# Patient Record
Sex: Male | Born: 1975 | Race: White | Hispanic: No | Marital: Married | Smoking: Never smoker
Health system: Southern US, Community
[De-identification: ages and names within clinical notes are randomized; demographics above are authoritative.]

## PROBLEM LIST (undated history)

## (undated) DIAGNOSIS — J45909 Unspecified asthma, uncomplicated: Secondary | ICD-10-CM

## (undated) DIAGNOSIS — D869 Sarcoidosis, unspecified: Secondary | ICD-10-CM

## (undated) DIAGNOSIS — E669 Obesity, unspecified: Secondary | ICD-10-CM

## (undated) DIAGNOSIS — M1990 Unspecified osteoarthritis, unspecified site: Secondary | ICD-10-CM

## (undated) DIAGNOSIS — T7840XA Allergy, unspecified, initial encounter: Secondary | ICD-10-CM

## (undated) DIAGNOSIS — A6000 Herpesviral infection of urogenital system, unspecified: Secondary | ICD-10-CM

## (undated) DIAGNOSIS — IMO0002 Reserved for concepts with insufficient information to code with codable children: Secondary | ICD-10-CM

## (undated) HISTORY — DX: Allergy, unspecified, initial encounter: T78.40XA

## (undated) HISTORY — DX: Herpesviral infection of urogenital system, unspecified: A60.00

## (undated) HISTORY — DX: Reserved for concepts with insufficient information to code with codable children: IMO0002

## (undated) HISTORY — DX: Unspecified osteoarthritis, unspecified site: M19.90

## (undated) HISTORY — DX: Obesity, unspecified: E66.9

## (undated) HISTORY — DX: Sarcoidosis, unspecified: D86.9

---

## 2005-04-09 ENCOUNTER — Encounter: Admission: RE | Admit: 2005-04-09 | Discharge: 2005-04-09 | Payer: Self-pay | Admitting: Family Medicine

## 2005-05-23 ENCOUNTER — Encounter: Admission: RE | Admit: 2005-05-23 | Discharge: 2005-05-23 | Payer: Self-pay | Admitting: Family Medicine

## 2005-10-15 HISTORY — PX: LUMBAR DISC SURGERY: SHX700

## 2005-11-14 ENCOUNTER — Ambulatory Visit (HOSPITAL_COMMUNITY): Admission: RE | Admit: 2005-11-14 | Discharge: 2005-11-15 | Payer: Self-pay | Admitting: Orthopedic Surgery

## 2006-06-26 ENCOUNTER — Ambulatory Visit: Payer: Self-pay | Admitting: Family Medicine

## 2008-09-04 ENCOUNTER — Emergency Department (HOSPITAL_COMMUNITY): Admission: EM | Admit: 2008-09-04 | Discharge: 2008-09-04 | Payer: Self-pay | Admitting: Emergency Medicine

## 2011-03-02 NOTE — Op Note (Signed)
NAME:  DUFF, POZZI           ACCOUNT NO.:  [id number]   MEDICAL RECORD NO.:  [id number]          PATIENT TYPE:  OIB   LOCATION:  1008                         FACILITY:  [facility]   PHYSICIAN:  Georges Lynch. Gioffre, M.D.DATE OF BIRTH:  08-07-1976   DATE OF PROCEDURE:  11/14/2005  DATE OF DISCHARGE:                                 OPERATIVE REPORT   SURGEON:  Dr. Darrelyn Hillock   ASSISTANT:  Dr. Jene Every.   PREOPERATIVE DIAGNOSIS:  Large herniated lumbar disk L5-S1 on the right.   POSTOPERATIVE DIAGNOSIS:  Large herniated lumbar disk L5-S1 on the right.   OPERATION:  Hemilaminectomy and microdiskectomy at L5-S1 on the right.   PROCEDURE:  Under general anesthesia, the patient first had 1 gram of IV  Ancef preop.  Sterile prep and draping of the back was carried out.  Two  needles were placed in the back for localization purposes.  X-ray was taken.  We identified the L5-S1 interspace.  An incision was made over the L5-S1  interspace, bleeders identified and cauterized.  Following that, self-  retaining retractors were inserted.  The incision was carried down through  the lumbar fascia, and muscles were separated then from the lamina and  spinous processes by sharp dissection.  The self-retaining retractors were  advanced.  At this time, we identified the L5-S1 interspace; an x-ray was  taken again.  Once we identified the appropriate space, we then carried out  a hemilaminectomy in the usual fashion.  Great care was taken to protect the  dura at all times.  We then removed the ligamentum flavum.  We did utilize  cottonoids up under the ligamentum flavum to protect the dura.  We then went  down and gently retracted the dura and the S1 root with the D'Errico  retractor.  We cauterized lateral recess veins, identified the disk, and he  had an extremely large herniated disk.  A cruciate incision was made in the  posterior longitudinal ligament and, literally, the disk and the fragments  extruded out under pressure.  We then completed the diskectomy.  Multiple  attempts were made into the disk space make sure we had all the disk out.  We then were able to utilize a hockey-stick to run down the S1 root to make  sure it was free and, also, we used the hockey-stick up proximally and  medially, and there were no other disk fragments noted.  The dura and nerve  root was nicely decompressed.  We thoroughly irrigated out the area.  I  loosely applied some thrombin-soaked Gelfoam.  The deep part of the wound  was closed except for a very small portion proximally for drainage purposes.  The remaining part of the wound was closed in the usual fashion.  I injected  30 mL of 0.5% Marcaine with epinephrine into the muscle.  The man had a  large tattoo on his back that we made a careful attempt to bring the tattoo  back to its original position.  We utilized metal staples and closed the  skin.  Sterile Neosporin dressing was applied.  ______________________________  Georges Lynch Darrelyn Hillock, M.D.     RAG/MEDQ  D:  11/14/2005  T:  11/14/2005  Job:  [id number]

## 2011-07-17 LAB — DIFFERENTIAL
Basophils Absolute: 0
Basophils Relative: 0
Eosinophils Absolute: 0.2
Monocytes Relative: 9
Neutrophils Relative %: 70

## 2011-07-17 LAB — CBC
HCT: 41.1
MCHC: 34.1
MCV: 88
Platelets: 333

## 2011-07-17 LAB — URINALYSIS, ROUTINE W REFLEX MICROSCOPIC
Bilirubin Urine: NEGATIVE
Glucose, UA: NEGATIVE
Hgb urine dipstick: NEGATIVE
Protein, ur: NEGATIVE
Urobilinogen, UA: 0.2

## 2011-07-17 LAB — BASIC METABOLIC PANEL
BUN: 13
Calcium: 9.5
Chloride: 106
GFR calc Af Amer: >60
Glucose, Bld: 96

## 2011-10-16 HISTORY — PX: LYMPH NODE BIOPSY: SHX201

## 2012-03-14 ENCOUNTER — Telehealth (INDEPENDENT_AMBULATORY_CARE_PROVIDER_SITE_OTHER): Payer: Self-pay

## 2012-03-14 NOTE — Telephone Encounter (Signed)
Dr Lanier Ensign referred the pt for an enlarged lymph node.  He is not able to get in before 6/25 and they want to know if you recommend he order any tests prior?  Please call Carollee Herter and let them know.

## 2012-03-25 ENCOUNTER — Encounter (INDEPENDENT_AMBULATORY_CARE_PROVIDER_SITE_OTHER): Payer: Self-pay | Admitting: Surgery

## 2012-03-28 ENCOUNTER — Ambulatory Visit (INDEPENDENT_AMBULATORY_CARE_PROVIDER_SITE_OTHER): Payer: BC Managed Care – PPO | Admitting: Surgery

## 2012-03-31 ENCOUNTER — Encounter (INDEPENDENT_AMBULATORY_CARE_PROVIDER_SITE_OTHER): Payer: Self-pay | Admitting: Surgery

## 2012-03-31 ENCOUNTER — Ambulatory Visit (INDEPENDENT_AMBULATORY_CARE_PROVIDER_SITE_OTHER): Payer: BC Managed Care – PPO | Admitting: Surgery

## 2012-03-31 VITALS — BP 120/82 | HR 60 | Temp 97.6°F | Resp 18 | Ht 68.5 in | Wt 213.0 lb

## 2012-03-31 DIAGNOSIS — R590 Localized enlarged lymph nodes: Secondary | ICD-10-CM | POA: Insufficient documentation

## 2012-03-31 DIAGNOSIS — R599 Enlarged lymph nodes, unspecified: Secondary | ICD-10-CM

## 2012-03-31 NOTE — Progress Notes (Signed)
Patient ID: Frederick Macias, male   DOB: [birth year], 36 y.o.   MRN: [id number]  Chief Complaint  Patient presents with  . Other    lymphadenopathy    HPI Frederick Macias is a 36 y.o. male.   this is a pleasant gentleman referred by Dr. Lenise Arena at [place] at [facility] for evaluation of inguinal lymphadenopathy. The patient first noticed large lymph nodes in his groins several months ago when showering. These are causing slight discomfort and have gotten larger. He has been on multiple courses of antibiotics and has not improved. He has increased his medications for his herpes but has not had an outbreak in 8 years. He has no constitutional symptoms. He denies fevers or chills. He did have some right axillary discomfort and some low back pain which is now resolved. He is otherwise without complaints. The discomfort is only mild HPI  Past Medical History  Diagnosis Date  . Genital herpes   . Allergy   . Arthritis   . Obesity     History reviewed. No pertinent past surgical history.  History reviewed. No pertinent family history.  Social History History  Substance Use Topics  . Smoking status: Never Smoker   . Smokeless tobacco: Not on file  . Alcohol Use: Yes    No Known Allergies  Current Outpatient Prescriptions  Medication Sig Dispense Refill  . cetirizine (ZYRTEC) 10 MG tablet Take 10 mg by mouth daily.      . ciprofloxacin (CIPRO) 500 MG/5ML (10%) suspension Take by mouth 2 (two) times daily.      . Multiple Vitamins-Minerals (MULTIVITAMIN WITH MINERALS) tablet Take 1 tablet by mouth daily.      . Naproxen Sodium (NAPRELAN) 375 MG TB24 Take by mouth.      . valACYclovir (VALTREX) 500 MG tablet Take 500 mg by mouth 2 (two) times daily.        Review of Systems Review of Systems  Constitutional: Negative for fever, chills, fatigue and unexpected weight change.  HENT: Negative for hearing loss, congestion, sore throat, trouble swallowing and voice change.   Eyes:  Negative for visual disturbance.  Respiratory: Negative for cough and wheezing.   Cardiovascular: Negative for chest pain, palpitations and leg swelling.  Gastrointestinal: Negative for nausea, vomiting, abdominal pain, diarrhea, constipation, blood in stool, abdominal distention, anal bleeding and rectal pain.  Genitourinary: Negative for hematuria and difficulty urinating.  Musculoskeletal: Positive for back pain. Negative for arthralgias.  Skin: Negative for rash and wound.  Neurological: Negative for seizures, syncope, weakness and headaches.  Hematological: Negative for adenopathy. Does not bruise/bleed easily.  Psychiatric/Behavioral: Negative for confusion.    Blood pressure 120/82, pulse 60, temperature 97.6 F (36.4 C), temperature source Temporal, resp. rate 18, height 5' 8.5" (1.74 m), weight 213 lb (96.616 kg).  Physical Exam Physical Exam  Constitutional: He is oriented to person, place, and time. He appears well-developed and well-nourished. No distress.  HENT:  Head: Normocephalic and atraumatic.  Right Ear: External ear normal.  Left Ear: External ear normal.  Nose: Nose normal.  Mouth/Throat: Oropharynx is clear and moist. No oropharyngeal exudate.  Eyes: Conjunctivae and EOM are normal. Pupils are equal, round, and reactive to light. No scleral icterus.  Neck: Normal range of motion. Neck supple. No tracheal deviation present. No thyromegaly present.  Cardiovascular: Normal rate, regular rhythm, normal heart sounds and intact distal pulses.   No murmur heard. Pulmonary/Chest: Effort normal and breath sounds normal. No respiratory distress. He has no  wheezes. He has no rales.  Abdominal: Soft. Bowel sounds are normal. He exhibits no distension. There is no tenderness. There is no rebound.  Musculoskeletal: Normal range of motion. He exhibits no edema and no tenderness.  Lymphadenopathy:    He has no cervical adenopathy.    He has no axillary adenopathy.       Right:  Inguinal adenopathy present.       Left: Inguinal adenopathy present.  Neurological: He is alert and oriented to person, place, and time.  Skin: Skin is warm and dry. No rash noted. He is not diaphoretic. No erythema.  Psychiatric: His behavior is normal. Judgment normal.    Data Reviewed I have the notes from Dr. Lenise Arena which I have reviewed  Assessment    Bilateral inguinal lymphadenopathy    Plan    We will proceed with excisional biopsy of lymph node in the operating room for histologic evaluation to rule out malignancy. I discussed this with him in detail. Her only other option would be to proceed with x-rays and CAT scans which even if positive, would still necessitate a biopsy for histologic evaluation. I discussed the risks of surgery which includes but is not limited to bleeding, infection, chronic swelling, injury to surrounding structures, and it has been noted nondiagnostic and further surgery is needed. He understands and wishes to proceed.       Kylan Veach A 03/31/2012, 9:15 AM

## 2012-04-07 ENCOUNTER — Other Ambulatory Visit (INDEPENDENT_AMBULATORY_CARE_PROVIDER_SITE_OTHER): Payer: Self-pay | Admitting: Surgery

## 2012-04-07 DIAGNOSIS — R599 Enlarged lymph nodes, unspecified: Secondary | ICD-10-CM

## 2012-04-08 ENCOUNTER — Ambulatory Visit (INDEPENDENT_AMBULATORY_CARE_PROVIDER_SITE_OTHER): Payer: BC Managed Care – PPO | Admitting: Surgery

## 2012-04-08 ENCOUNTER — Telehealth (INDEPENDENT_AMBULATORY_CARE_PROVIDER_SITE_OTHER): Payer: Self-pay | Admitting: General Surgery

## 2012-04-08 NOTE — Telephone Encounter (Signed)
DR. Luisa Hart CALLED RE PATHOLOGY REPORT FROM LYMPH NODE SAMPLING. HE STATED" IT LOOKED LIKE A NON-NECROTIZING GRANULOMA BUT NEED MORE CLINICAL/ WOULD LIKE D. BLACKMAN TO CALL HIM IN AM PLEASE. [phone]/ THANKS/GY/ MESSAGE ROUTED TO DR. BLACKMAN.

## 2012-04-09 ENCOUNTER — Encounter (INDEPENDENT_AMBULATORY_CARE_PROVIDER_SITE_OTHER): Payer: Self-pay

## 2012-04-16 ENCOUNTER — Ambulatory Visit
Admission: RE | Admit: 2012-04-16 | Discharge: 2012-04-16 | Disposition: A | Discharge status: Home / Self Care | Payer: BC Managed Care – PPO | Source: Ambulatory Visit | Attending: Surgery | Admitting: Surgery

## 2012-04-16 ENCOUNTER — Encounter (INDEPENDENT_AMBULATORY_CARE_PROVIDER_SITE_OTHER): Payer: Self-pay | Admitting: Surgery

## 2012-04-16 ENCOUNTER — Ambulatory Visit (INDEPENDENT_AMBULATORY_CARE_PROVIDER_SITE_OTHER): Payer: BC Managed Care – PPO | Admitting: Surgery

## 2012-04-16 VITALS — BP 118/72 | HR 80 | Temp 97.4°F | Resp 18 | Ht 68.0 in | Wt 218.0 lb

## 2012-04-16 DIAGNOSIS — D869 Sarcoidosis, unspecified: Secondary | ICD-10-CM

## 2012-04-16 DIAGNOSIS — Z09 Encounter for follow-up examination after completed treatment for conditions other than malignant neoplasm: Secondary | ICD-10-CM

## 2012-04-16 NOTE — Progress Notes (Signed)
Subjective:     Patient ID: Frederick Macias, male   DOB: [birth year], 36 y.o.   MRN: [id number]  HPI He is here for his first postop visit status post excisional biopsy of large right inguinal lymph nodes. He is doing well and has only mild discomfort  Review of Systems     Objective:   Physical Exam On exam, his incision does have a seroma underneath. It is otherwise feeling well. I drained 10 cc of seroma fluid with a needle.  The pathology showed findings consistent with sarcoidosis. There was no evidence of malignancy    Assessment:     Patient status post excisional biopsy of right inguinal lymph nodes with a probable diagnosis of sarcoid    Plan:     I am going to go ahead and check a chest x-ray and refer him back to his primary care physician. He is otherwise asymptomatic. We had a discussion about sarcoid in the office as well as on the phone when I first learned of his diagnosis

## 2012-08-26 ENCOUNTER — Encounter: Payer: Self-pay | Admitting: *Deleted

## 2012-08-27 ENCOUNTER — Encounter: Payer: Self-pay | Admitting: Internal Medicine

## 2012-08-27 ENCOUNTER — Ambulatory Visit (INDEPENDENT_AMBULATORY_CARE_PROVIDER_SITE_OTHER): Payer: BC Managed Care – PPO | Admitting: Internal Medicine

## 2012-08-27 VITALS — BP 120/78 | HR 91 | Ht 68.5 in | Wt 234.0 lb

## 2012-08-27 DIAGNOSIS — D869 Sarcoidosis, unspecified: Secondary | ICD-10-CM

## 2012-08-27 DIAGNOSIS — R05 Cough: Secondary | ICD-10-CM

## 2012-08-27 NOTE — Progress Notes (Signed)
  Subjective:    Patient ID: Frederick Macias, male    DOB: 22-Dec-1975  MRN: [id number]  HPI  71 yowm never smoker with swelling R > L inguinal spring 2013 > bx c/w sarcoid never rx steroids,  referred 08/27/2012  By Rosana Berger to pulmonary clinic with abn cxr.   08/27/2012 1st pulmonary eval cc mild cough onset insidious x one year min productive daily more each am but not typically waking at night and chest discomfort with heavy exertion and less ex tolerance assoc with less activity and wt gain but no limiting sob - takes naprosyn for back pain since 2007 and cough better with zyrtec/ robitussin.  No peripheral arthralgias, rash, viz problems, ns, wt loss.  Sleeping ok without nocturnal  or early am exacerbation  of respiratory  c/o's or need for noct saba. Also denies any obvious fluctuation of symptoms with weather or environmental changes or other aggravating or alleviating factors except as outlined above   Review of Systems  Constitutional: Negative for fever and unexpected weight change.  HENT: Negative for ear pain, nosebleeds, congestion, sore throat, rhinorrhea, sneezing, trouble swallowing, dental problem, postnasal drip and sinus pressure.   Eyes: Negative for redness and itching.  Respiratory: Positive for cough. Negative for chest tightness, shortness of breath and wheezing.   Cardiovascular: Positive for chest pain. Negative for palpitations and leg swelling.  Gastrointestinal: Negative for nausea and vomiting.  Genitourinary: Negative for dysuria.  Musculoskeletal: Negative for joint swelling.  Skin: Negative for rash.  Neurological: Positive for headaches.  Hematological: Does not bruise/bleed easily.  Psychiatric/Behavioral: Negative for dysphoric mood. The patient is not nervous/anxious.        Objective:   Physical Exam  Wt Readings from Last 3 Encounters:  08/27/12 234 lb (106.142 kg)  04/16/12 218 lb (98.884 kg)  03/31/12 213 lb (96.616 kg)   amb  pleasant wm nad HEENT: nl dentition, turbinates, and orophanx. Nl external ear canals without cough reflex   NECK :  without JVD/Nodes/TM/ nl carotid upstrokes bilaterally   LUNGS: no acc muscle use, clear to A and P bilaterally without cough on insp or exp maneuvers   CV:  RRR  no s3 or murmur or increase in P2, no edema   ABD:  soft and nontender with nl excursion in the supine position. No bruits or organomegaly, bowel sounds nl - Large marble sized R Inguinal Node  MS:  warm without deformities, calf tenderness, cyanosis or clubbing  SKIN: warm and dry without lesions    NEURO:  alert, approp, no deficits   cxr 04/16/12 1. Mild prominence of the right paratracheal stripe than hilar  areas bilaterally is compatible with sarcoid.  2. No acute abnormality otherwise     Assessment & Plan:

## 2012-08-27 NOTE — Patient Instructions (Addendum)
Sarcoidosis is a benign inflammatory condition caused by  The  immune system being too revved up like a thermostat on your furnace that's partially  stuck causing arthitis, rash, short of breath and cough and vision issues.  It typically burns itself out in 75% of patients by the end of 3 years with little to indicate that we really change the natural course of the disease by aggressive treatments intended to alter it.  Treatment is generally reserved for patients with major symptoms (worse cough or sob with activity)  we can attribute to sarcoid or if a vital organ (like the eye or kidney or nervous system) becomes affected.    You must see you eye doctor and let him know you have sarcoid.  Add pepcid 20 mg one at bedtime for a month to see if helps cough  .GERD (REFLUX)  is an extremely common cause of respiratory symptoms, many times with no significant heartburn at all.    It can be treated with medication, but also with lifestyle changes including avoidance of late meals, excessive alcohol, smoking cessation, and avoid fatty foods, chocolate, peppermint, colas, red wine, and acidic juices such as orange juice.  NO MINT OR MENTHOL PRODUCTS SO NO COUGH DROPS  USE SUGARLESS CANDY INSTEAD (jolley ranchers or Stover's)  NO OIL BASED VITAMINS - use powdered substitutes.    Please schedule a follow up visit in 3 months but call sooner if needed with PFT's and cxr.

## 2012-08-29 DIAGNOSIS — D869 Sarcoidosis, unspecified: Secondary | ICD-10-CM | POA: Insufficient documentation

## 2012-08-29 DIAGNOSIS — R05 Cough: Secondary | ICD-10-CM | POA: Insufficient documentation

## 2012-08-29 NOTE — Assessment & Plan Note (Signed)
cxr is not classic for sarcoid but along with the inguinal bx is convincing this is sarcoid but not clear the cough and the sarcoid are really related.  A good rule of thumb though is that if, during the first year of symptoms from sarcoid, prednisone is not needed to control symptoms, it is unlikely   to be needed at at later date.  Extended discussion with pt re rx.  Discussed in detail all the  indications, usual  risks and alternatives  relative to the benefits with patient who agrees to proceed with conservative rx only  See instructions for specific recommendations which were reviewed directly with the patient who was given a copy with highlighter outlining the key components.

## 2012-08-29 NOTE — Assessment & Plan Note (Signed)
This is most c/w  Classic Upper airway cough syndrome, so named because it's frequently impossible to sort out how much is  CR/sinusitis with freq throat clearing (which can be related to primary GERD)   vs  causing  secondary (" extra esophageal")  GERD from wide swings in gastric pressure that occur with throat clearing, often  promoting self use of mint and menthol lozenges that reduce the lower esophageal sphincter tone and exacerbate the problem further in a cyclical fashion.   These are the same pts (now being labeled as having "irritable larynx syndrome" by some cough centers) who not infrequently have a history of having failed to tolerate ace inhibitors,  dry powder inhalers or biphosphonates or report having atypical reflux symptoms that don't respond to standard doses of PPI , and are easily confused as having aecopd or asthma flares by even experienced allergists/ pulmonologists.  For now add pepcid at hs and continue zyrtec and prn rob dm and regroup in 3 months with pfts and cxr, sooner if cough worsens

## 2012-12-01 ENCOUNTER — Ambulatory Visit: Payer: BC Managed Care – PPO | Admitting: Internal Medicine

## 2013-04-14 ENCOUNTER — Encounter: Payer: Self-pay | Admitting: Internal Medicine

## 2013-04-14 ENCOUNTER — Ambulatory Visit (INDEPENDENT_AMBULATORY_CARE_PROVIDER_SITE_OTHER): Payer: BC Managed Care – PPO | Admitting: Internal Medicine

## 2013-04-14 ENCOUNTER — Ambulatory Visit (INDEPENDENT_AMBULATORY_CARE_PROVIDER_SITE_OTHER)
Admission: RE | Admit: 2013-04-14 | Discharge: 2013-04-14 | Disposition: A | Discharge status: Home / Self Care | Payer: BC Managed Care – PPO | Source: Ambulatory Visit | Attending: Internal Medicine | Admitting: Internal Medicine

## 2013-04-14 VITALS — BP 124/82 | HR 70 | Temp 97.6°F | Ht 68.5 in | Wt 234.0 lb

## 2013-04-14 DIAGNOSIS — D869 Sarcoidosis, unspecified: Secondary | ICD-10-CM

## 2013-04-14 DIAGNOSIS — R05 Cough: Secondary | ICD-10-CM

## 2013-04-14 NOTE — Progress Notes (Signed)
PFT done today. 

## 2013-04-14 NOTE — Assessment & Plan Note (Signed)
Not typical of sarcoid unless contributing to  Classic Upper airway cough syndrome, so named because it's frequently impossible to sort out how much is  CR/sinusitis with freq throat clearing (which can be related to primary GERD)   vs  causing  secondary (" extra esophageal")  GERD from wide swings in gastric pressure that occur with throat clearing, often  promoting self use of mint and menthol lozenges that reduce the lower esophageal sphincter tone and exacerbate the problem further in a cyclical fashion.   These are the same pts (now being labeled as having "irritable larynx syndrome" by some cough centers) who not infrequently have a history of having failed to tolerate ace inhibitors,  dry powder inhalers or biphosphonates or report having atypical reflux symptoms that don't respond to standard doses of PPI , and are easily confused as having aecopd or asthma flares by even experienced allergists/ pulmonologists.   For now add h1 to h2 at hs and see what difference if any this makes and if not > sinus CT next

## 2013-04-14 NOTE — Assessment & Plan Note (Signed)
-   Symptom onset (cough and groin adenopathy late 2012)  - Bx 04/07/12  Inguinal nodes  > Pos NCG  - Ophthalmology eval rec 08/27/12  LN appears slt smaller > rx pain with prn nsaid's     Each maintenance medication was reviewed in detail including most importantly the difference between maintenance and as needed and under what circumstances the prns are to be used.  Please see instructions for details which were reviewed in writing and the patient given a copy.

## 2013-04-14 NOTE — Progress Notes (Signed)
Subjective:    Patient ID: Frederick Macias, male    DOB: [birth year]   MRN: [id number]    Brief patient profile:  13 yowm never smoker with swelling R > L inguinal spring 2013 > bx c/w sarcoid never rx steroids,  referred 08/27/2012  By Rosana Berger to pulmonary clinic with abn cxr.   08/27/2012 1st pulmonary eval cc mild cough onset insidious x one year min productive daily more each am but not typically waking at night and chest discomfort with heavy exertion and less ex tolerance assoc with less activity and wt gain but no limiting sob - takes naprosyn for back pain since 2007 and cough better with zyrtec/ robitussin.  No peripheral arthralgias, rash, viz problems, ns, wt loss. rec Add pepcid 20 mg one at bedtime for a month to see if helps cough GERD diet   04/14/2013 f/u ov/Wert re sarcoid Chief Complaint  Patient presents with  . Follow-up    PFTs done today-- reports breathing seems to be "normal" denies any chest tightness or other complaints  Main issue is the throat clearing p wakes up and at hs but no excess mucus   No obvious daytime variabilty or assoc chronic cough or cp or chest tightness, subjective wheeze overt sinus or hb symptoms. No unusual exp hx or h/o childhood pna/ asthma or knowledge of premature birth.   Sleeping ok without nocturnal  or early am exacerbation  of respiratory  c/o's or need for noct saba. Also denies any obvious fluctuation of symptoms with weather or environmental changes or other aggravating or alleviating factors except as outlined above   Current Medications, Allergies, Past Medical History, Past Surgical History, Family History, and Social History were reviewed in [employer] record.  ROS  The following are not active complaints unless bolded sore throat, dysphagia, dental problems, itching, sneezing,  nasal congestion or excess/ purulent secretions, ear ache,   fever, chills, sweats, unintended wt loss, pleuritic or  exertional cp, hemoptysis,  orthopnea pnd or leg swelling, presyncope, palpitations, heartburn, abdominal pain, anorexia, nausea, vomiting, diarrhea  or change in bowel or urinary habits, change in stools or urine, dysuria,hematuria,  rash, arthralgias, visual complaints, headache, numbness weakness or ataxia or problems with walking or coordination,  change in mood/affect or memory.                 Objective:   Physical Exam  04/14/2013         234  Wt Readings from Last 3 Encounters:  08/27/12 234 lb (106.142 kg)  04/16/12 218 lb (98.884 kg)  03/31/12 213 lb (96.616 kg)   amb pleasant wm nad HEENT: nl dentition, turbinates, and orophanx. Nl external ear canals without cough reflex   NECK :  without JVD/Nodes/TM/ nl carotid upstrokes bilaterally   LUNGS: no acc muscle use, clear to A and P bilaterally without cough on insp or exp maneuvers   CV:  RRR  no s3 or murmur or increase in P2, no edema   ABD:  soft and nontender with nl excursion in the supine position. No bruits or organomegaly, bowel sounds nl - Medium marble sized R Inguinal Node  MS:  warm without deformities, calf tenderness, cyanosis or clubbing  SKIN: warm and dry without lesions    NEURO:  alert, approp, no deficits     CXR  04/14/2013 : Stable appearance. Suspect a degree of right paratracheal and left hilar adenopathy, findings that would be consistent with sarcoidosis. Lungs  clear.       Assessment & Plan:

## 2013-04-14 NOTE — Patient Instructions (Addendum)
Try chlortrimeton 4 mg and pepcid 20 mg one each at bedtime x at least a month and if cough not better > call [facility] and ask for CT Sinus  Naproxen as needed for groin pain  Please schedule a follow up visit in 6 months but call sooner if needed

## 2013-10-30 ENCOUNTER — Ambulatory Visit (INDEPENDENT_AMBULATORY_CARE_PROVIDER_SITE_OTHER)
Admission: RE | Admit: 2013-10-30 | Discharge: 2013-10-30 | Disposition: A | Discharge status: Home / Self Care | Payer: BC Managed Care – PPO | Source: Ambulatory Visit | Attending: Internal Medicine | Admitting: Internal Medicine

## 2013-10-30 ENCOUNTER — Encounter: Payer: Self-pay | Admitting: Internal Medicine

## 2013-10-30 ENCOUNTER — Ambulatory Visit (INDEPENDENT_AMBULATORY_CARE_PROVIDER_SITE_OTHER): Payer: BC Managed Care – PPO | Admitting: Internal Medicine

## 2013-10-30 VITALS — BP 106/60 | HR 60 | Temp 98.1°F | Ht 68.5 in | Wt 237.0 lb

## 2013-10-30 DIAGNOSIS — D869 Sarcoidosis, unspecified: Secondary | ICD-10-CM

## 2013-10-30 DIAGNOSIS — R059 Cough, unspecified: Secondary | ICD-10-CM

## 2013-10-30 DIAGNOSIS — R05 Cough: Secondary | ICD-10-CM

## 2013-10-30 NOTE — Progress Notes (Signed)
Subjective:    Patient ID: Frederick Macias, male    DOB: 11/07/1975   MRN: [id number]    Brief patient profile:  89 yowm never smoker with swelling R > L inguinal spring 2013 > bx c/w sarcoid never rx steroids,  referred 08/27/2012  By Rosana Berger to pulmonary clinic with abn cxr.   History of Present Illness  08/27/2012 1st pulmonary eval cc mild cough onset insidious x one year min productive daily more each am but not typically waking at night and chest discomfort with heavy exertion and less ex tolerance assoc with less activity and wt gain but no limiting sob - takes naprosyn for back pain since 2007 and cough better with zyrtec/ robitussin.  No peripheral arthralgias, rash, viz problems, ns, wt loss. rec Add pepcid 20 mg one at bedtime for a month to see if helps cough GERD diet   04/14/2013 f/u ov/Najee Manninen re sarcoid Chief Complaint  Patient presents with  . Follow-up    PFTs done today-- reports breathing seems to be "normal" denies any chest tightness or other complaints  Main issue is the throat clearing p wakes up and at hs but no excess mucus rec Try chlortrimeton 4 mg and pepcid 20 mg one each at bedtime x at least a month and if cough not better > call [facility] [phone] and ask for CT Sinus Naproxen as needed for groin pain   10/30/2013 f/u ov/Ryna Beckstrom re: sarcoid with mostly lnguinal  swelling / cough gone p zyrtec, only on prn naprosyn Chief Complaint  Patient presents with  . Follow-up    Pt denies any respiratory co's. He c/o still being bothered by groin pain.       No obvious daytime variabilty or assoc chronic cough or cp or chest tightness, subjective wheeze overt sinus or hb symptoms. No unusual exp hx or h/o childhood pna/ asthma or knowledge of premature birth.   Sleeping ok without nocturnal  or early am exacerbation  of respiratory  c/o's or need for noct saba. Also denies any obvious fluctuation of symptoms with weather or environmental changes or other  aggravating or alleviating factors except as outlined above   Current Medications, Allergies, Past Medical History, Past Surgical History, Family History, and Social History were reviewed in [employer] record.  ROS  The following are not active complaints unless bolded sore throat, dysphagia, dental problems, itching, sneezing,  nasal congestion or excess/ purulent secretions, ear ache,   fever, chills, sweats, unintended wt loss, pleuritic or exertional cp, hemoptysis,  orthopnea pnd or leg swelling, presyncope, palpitations, heartburn, abdominal pain, anorexia, nausea, vomiting, diarrhea  or change in bowel or urinary habits, change in stools or urine, dysuria,hematuria,  rash, arthralgias, visual complaints, headache, numbness weakness or ataxia or problems with walking or coordination,  change in mood/affect or memory.                 Objective:   Physical Exam  04/14/2013         234  Vs 10/30/2013   237   Wt Readings from Last 3 Encounters:  08/27/12 234 lb (106.142 kg)  04/16/12 218 lb (98.884 kg)  03/31/12 213 lb (96.616 kg)   amb pleasant wm nad HEENT: nl dentition, turbinates, and orophanx. Nl external ear canals without cough reflex   NECK :  without JVD/Nodes/TM/ nl carotid upstrokes bilaterally   LUNGS: no acc muscle use, clear to A and P bilaterally without cough on insp or  exp maneuvers   CV:  RRR  no s3 or murmur or increase in P2, no edema   ABD:  soft and nontender with nl excursion in the supine position. No bruits or organomegaly, bowel sounds nl - Medium marble sized R Inguinal Node min tender  MS:  warm without deformities, calf tenderness, cyanosis or clubbing  SKIN: warm and dry without lesions    NEURO:  alert, approp, no deficits     CXR  10/30/2013 :  Grossly unchanged cardiac silhouette and mediastinal contours with mild nodular prominence of the inferior aspect of the right paratracheal stripe as well as the bilateral  pulmonary hila, right greater than left. Grossly unchanged suspected approximately 0.8 cm nodule with the medial aspect of the right upper lung. Unchanged mild slightly nodular thickening of the pulmonary interstitium       Assessment & Plan:

## 2013-10-30 NOTE — Patient Instructions (Addendum)
Please remember to go to the  x-ray department downstairs for your tests - we will call you with the results when they are available.     

## 2013-10-31 NOTE — Assessment & Plan Note (Signed)
-  onset 09/2011    -rec add pepcid 20 mg qhs 08/27/12 > not better - added h1 and h2 04/14/2013 > resolved   Ok to re dose with zyrtec prn

## 2013-10-31 NOTE — Assessment & Plan Note (Addendum)
-   Symptom onset (cough and groin adenopathy late 2012)  - Bx 04/07/12  Inguinal nodes  > Pos NCG  - Ophthalmology eval rec 08/27/12> never went to rec again today  A good rule of thumb is that if prednisone is not needed at dx of sarcoid, it probably won't be later in the course and this is definitely the case here > no need for rx now in the third year of the dz, with hope for gradual "remission" likely in the next year > f/u can be in 12 months, sooner if needed

## 2015-04-28 ENCOUNTER — Other Ambulatory Visit (HOSPITAL_BASED_OUTPATIENT_CLINIC_OR_DEPARTMENT_OTHER): Payer: Self-pay | Admitting: Family Medicine

## 2015-04-28 DIAGNOSIS — M25511 Pain in right shoulder: Secondary | ICD-10-CM

## 2015-04-30 ENCOUNTER — Ambulatory Visit (HOSPITAL_BASED_OUTPATIENT_CLINIC_OR_DEPARTMENT_OTHER)
Admission: RE | Admit: 2015-04-30 | Discharge: 2015-04-30 | Disposition: A | Discharge status: Home / Self Care | Payer: BLUE CROSS/BLUE SHIELD | Source: Ambulatory Visit | Attending: Family Medicine | Admitting: Family Medicine

## 2015-04-30 DIAGNOSIS — M25511 Pain in right shoulder: Secondary | ICD-10-CM | POA: Diagnosis present

## 2015-04-30 DIAGNOSIS — R609 Edema, unspecified: Secondary | ICD-10-CM | POA: Diagnosis not present

## 2016-08-20 DIAGNOSIS — Z23 Encounter for immunization: Secondary | ICD-10-CM | POA: Diagnosis not present

## 2016-08-20 DIAGNOSIS — R079 Chest pain, unspecified: Secondary | ICD-10-CM | POA: Diagnosis not present

## 2016-09-12 DIAGNOSIS — F411 Generalized anxiety disorder: Secondary | ICD-10-CM | POA: Diagnosis not present

## 2016-09-12 DIAGNOSIS — Z0000 Encounter for general adult medical examination without abnormal findings: Secondary | ICD-10-CM | POA: Diagnosis not present

## 2016-09-20 DIAGNOSIS — F419 Anxiety disorder, unspecified: Secondary | ICD-10-CM | POA: Diagnosis not present

## 2016-09-28 DIAGNOSIS — F419 Anxiety disorder, unspecified: Secondary | ICD-10-CM | POA: Diagnosis not present

## 2016-10-05 DIAGNOSIS — F419 Anxiety disorder, unspecified: Secondary | ICD-10-CM | POA: Diagnosis not present

## 2016-10-11 DIAGNOSIS — F419 Anxiety disorder, unspecified: Secondary | ICD-10-CM | POA: Diagnosis not present

## 2016-10-25 DIAGNOSIS — F419 Anxiety disorder, unspecified: Secondary | ICD-10-CM | POA: Diagnosis not present

## 2016-11-02 DIAGNOSIS — F419 Anxiety disorder, unspecified: Secondary | ICD-10-CM | POA: Diagnosis not present

## 2016-11-07 DIAGNOSIS — J111 Influenza due to unidentified influenza virus with other respiratory manifestations: Secondary | ICD-10-CM | POA: Diagnosis not present

## 2016-11-07 DIAGNOSIS — R6889 Other general symptoms and signs: Secondary | ICD-10-CM | POA: Diagnosis not present

## 2016-11-13 DIAGNOSIS — F419 Anxiety disorder, unspecified: Secondary | ICD-10-CM | POA: Diagnosis not present

## 2016-11-27 DIAGNOSIS — F419 Anxiety disorder, unspecified: Secondary | ICD-10-CM | POA: Diagnosis not present

## 2016-12-14 DIAGNOSIS — F419 Anxiety disorder, unspecified: Secondary | ICD-10-CM | POA: Diagnosis not present

## 2016-12-21 DIAGNOSIS — F419 Anxiety disorder, unspecified: Secondary | ICD-10-CM | POA: Diagnosis not present

## 2017-01-04 DIAGNOSIS — F419 Anxiety disorder, unspecified: Secondary | ICD-10-CM | POA: Diagnosis not present

## 2017-01-11 DIAGNOSIS — F419 Anxiety disorder, unspecified: Secondary | ICD-10-CM | POA: Diagnosis not present

## 2017-01-17 DIAGNOSIS — F419 Anxiety disorder, unspecified: Secondary | ICD-10-CM | POA: Diagnosis not present

## 2017-01-25 DIAGNOSIS — F419 Anxiety disorder, unspecified: Secondary | ICD-10-CM | POA: Diagnosis not present

## 2017-01-29 DIAGNOSIS — F411 Generalized anxiety disorder: Secondary | ICD-10-CM | POA: Diagnosis not present

## 2017-02-08 DIAGNOSIS — F419 Anxiety disorder, unspecified: Secondary | ICD-10-CM | POA: Diagnosis not present

## 2017-09-17 DIAGNOSIS — Z0000 Encounter for general adult medical examination without abnormal findings: Secondary | ICD-10-CM | POA: Diagnosis not present

## 2017-09-17 DIAGNOSIS — Z136 Encounter for screening for cardiovascular disorders: Secondary | ICD-10-CM | POA: Diagnosis not present

## 2017-09-17 DIAGNOSIS — Z131 Encounter for screening for diabetes mellitus: Secondary | ICD-10-CM | POA: Diagnosis not present

## 2017-09-17 DIAGNOSIS — M542 Cervicalgia: Secondary | ICD-10-CM | POA: Diagnosis not present

## 2017-09-17 DIAGNOSIS — F419 Anxiety disorder, unspecified: Secondary | ICD-10-CM | POA: Diagnosis not present

## 2017-10-03 DIAGNOSIS — M542 Cervicalgia: Secondary | ICD-10-CM | POA: Diagnosis not present

## 2017-10-09 DIAGNOSIS — M542 Cervicalgia: Secondary | ICD-10-CM | POA: Diagnosis not present

## 2017-10-10 DIAGNOSIS — M5020 Other cervical disc displacement, unspecified cervical region: Secondary | ICD-10-CM | POA: Diagnosis not present

## 2017-10-10 DIAGNOSIS — M542 Cervicalgia: Secondary | ICD-10-CM | POA: Diagnosis not present

## 2017-12-12 DIAGNOSIS — F419 Anxiety disorder, unspecified: Secondary | ICD-10-CM | POA: Diagnosis not present

## 2017-12-19 DIAGNOSIS — M5033 Other cervical disc degeneration, cervicothoracic region: Secondary | ICD-10-CM | POA: Diagnosis not present

## 2018-01-02 DIAGNOSIS — M542 Cervicalgia: Secondary | ICD-10-CM | POA: Diagnosis not present

## 2018-01-15 DIAGNOSIS — M542 Cervicalgia: Secondary | ICD-10-CM | POA: Diagnosis not present

## 2018-02-06 DIAGNOSIS — R05 Cough: Secondary | ICD-10-CM | POA: Diagnosis not present

## 2018-02-06 DIAGNOSIS — R21 Rash and other nonspecific skin eruption: Secondary | ICD-10-CM | POA: Diagnosis not present

## 2018-02-26 DIAGNOSIS — M47812 Spondylosis without myelopathy or radiculopathy, cervical region: Secondary | ICD-10-CM | POA: Diagnosis not present

## 2018-02-28 DIAGNOSIS — L821 Other seborrheic keratosis: Secondary | ICD-10-CM | POA: Diagnosis not present

## 2018-02-28 DIAGNOSIS — L308 Other specified dermatitis: Secondary | ICD-10-CM | POA: Diagnosis not present

## 2018-02-28 DIAGNOSIS — B078 Other viral warts: Secondary | ICD-10-CM | POA: Diagnosis not present

## 2018-03-27 DIAGNOSIS — M47812 Spondylosis without myelopathy or radiculopathy, cervical region: Secondary | ICD-10-CM | POA: Diagnosis not present

## 2018-07-07 DIAGNOSIS — R569 Unspecified convulsions: Secondary | ICD-10-CM | POA: Diagnosis not present

## 2018-07-09 DIAGNOSIS — R569 Unspecified convulsions: Secondary | ICD-10-CM | POA: Diagnosis not present

## 2018-07-17 DIAGNOSIS — Z23 Encounter for immunization: Secondary | ICD-10-CM | POA: Diagnosis not present

## 2018-09-30 DIAGNOSIS — Z131 Encounter for screening for diabetes mellitus: Secondary | ICD-10-CM | POA: Diagnosis not present

## 2018-09-30 DIAGNOSIS — Z13220 Encounter for screening for lipoid disorders: Secondary | ICD-10-CM | POA: Diagnosis not present

## 2018-09-30 DIAGNOSIS — Z0000 Encounter for general adult medical examination without abnormal findings: Secondary | ICD-10-CM | POA: Diagnosis not present

## 2018-09-30 DIAGNOSIS — F419 Anxiety disorder, unspecified: Secondary | ICD-10-CM | POA: Diagnosis not present

## 2018-11-21 DIAGNOSIS — F909 Attention-deficit hyperactivity disorder, unspecified type: Secondary | ICD-10-CM | POA: Diagnosis not present

## 2018-11-21 DIAGNOSIS — R569 Unspecified convulsions: Secondary | ICD-10-CM | POA: Diagnosis not present

## 2019-04-08 DIAGNOSIS — J029 Acute pharyngitis, unspecified: Secondary | ICD-10-CM | POA: Diagnosis not present

## 2019-06-05 DIAGNOSIS — R569 Unspecified convulsions: Secondary | ICD-10-CM | POA: Diagnosis not present

## 2019-06-05 DIAGNOSIS — F909 Attention-deficit hyperactivity disorder, unspecified type: Secondary | ICD-10-CM | POA: Diagnosis not present

## 2019-06-26 DIAGNOSIS — Z1159 Encounter for screening for other viral diseases: Secondary | ICD-10-CM | POA: Diagnosis not present

## 2019-10-15 DIAGNOSIS — Z0000 Encounter for general adult medical examination without abnormal findings: Secondary | ICD-10-CM | POA: Diagnosis not present

## 2019-12-30 DIAGNOSIS — Z23 Encounter for immunization: Secondary | ICD-10-CM | POA: Diagnosis not present

## 2020-01-27 DIAGNOSIS — Z23 Encounter for immunization: Secondary | ICD-10-CM | POA: Diagnosis not present

## 2020-02-17 DIAGNOSIS — H1045 Other chronic allergic conjunctivitis: Secondary | ICD-10-CM | POA: Diagnosis not present

## 2020-02-17 DIAGNOSIS — J3081 Allergic rhinitis due to animal (cat) (dog) hair and dander: Secondary | ICD-10-CM | POA: Diagnosis not present

## 2020-02-17 DIAGNOSIS — J3089 Other allergic rhinitis: Secondary | ICD-10-CM | POA: Diagnosis not present

## 2020-02-17 DIAGNOSIS — J301 Allergic rhinitis due to pollen: Secondary | ICD-10-CM | POA: Diagnosis not present

## 2020-02-22 DIAGNOSIS — J301 Allergic rhinitis due to pollen: Secondary | ICD-10-CM | POA: Diagnosis not present

## 2020-02-23 DIAGNOSIS — J3081 Allergic rhinitis due to animal (cat) (dog) hair and dander: Secondary | ICD-10-CM | POA: Diagnosis not present

## 2020-02-23 DIAGNOSIS — J3089 Other allergic rhinitis: Secondary | ICD-10-CM | POA: Diagnosis not present

## 2020-03-08 ENCOUNTER — Institutional Professional Consult (permissible substitution): Payer: BLUE CROSS/BLUE SHIELD | Admitting: Internal Medicine

## 2020-03-10 ENCOUNTER — Other Ambulatory Visit: Payer: Self-pay

## 2020-03-10 ENCOUNTER — Encounter: Payer: Self-pay | Admitting: Internal Medicine

## 2020-03-10 ENCOUNTER — Ambulatory Visit (INDEPENDENT_AMBULATORY_CARE_PROVIDER_SITE_OTHER): Payer: BC Managed Care – PPO | Admitting: Internal Medicine

## 2020-03-10 VITALS — BP 118/78 | HR 84 | Temp 97.8°F | Resp 18 | Ht 68.0 in | Wt 246.0 lb

## 2020-03-10 DIAGNOSIS — R002 Palpitations: Secondary | ICD-10-CM

## 2020-03-10 DIAGNOSIS — D869 Sarcoidosis, unspecified: Secondary | ICD-10-CM

## 2020-03-10 DIAGNOSIS — Z889 Allergy status to unspecified drugs, medicaments and biological substances status: Secondary | ICD-10-CM

## 2020-03-10 DIAGNOSIS — R0789 Other chest pain: Secondary | ICD-10-CM | POA: Diagnosis not present

## 2020-03-10 NOTE — Progress Notes (Signed)
Brief patient profile:  46 yowm never smoker with swelling R > L inguinal spring 2013 > bx c/w sarcoid never rx steroids,  referred 08/27/2012  By Rosana Berger to pulmonary clinic with abn cxr.   History of Present Illness  08/27/2012 1st pulmonary eval cc mild cough onset insidious x one year min productive daily more each am but not typically waking at night and chest discomfort with heavy exertion and less ex tolerance assoc with less activity and wt gain but no limiting sob - takes naprosyn for back pain since 2007 and cough better with zyrtec/ robitussin.  No peripheral arthralgias, rash, viz problems, ns, wt loss. rec Add pepcid 20 mg one at bedtime for a month to see if helps cough GERD diet   04/14/2013 f/u ov/Wert re sarcoid     Chief Complaint  Patient presents with  . Follow-up    PFTs done today-- reports breathing seems to be "normal" denies any chest tightness or other complaints  Main issue is the throat clearing p wakes up and at hs but no excess mucus rec Try chlortrimeton 4 mg and pepcid 20 mg one each at bedtime x at least a month and if cough not better > call [facility] [phone] and ask for CT Sinus Naproxen as needed for groin pain   10/30/2013 f/u ov/Wert re: sarcoid with mostly lnguinal  swelling / cough gone p zyrtec, only on prn naprosyn     Chief Complaint  Patient presents with  . Follow-up    Pt denies any respiratory co's. He c/o still being bothered by groin pain.       No obvious daytime variabilty or assoc chronic cough or cp or chest tightness, subjective wheeze overt sinus or hb symptoms. No unusual exp hx or h/o childhood pna/ asthma or knowledge of premature birth.   Sleeping ok without nocturnal  or early am exacerbation  of respiratory  c/o's or need for noct saba. Also denies any obvious fluctuation of symptoms with weather or environmental changes or other aggravating or alleviating factors except as outlined above   Current  Medications, Allergies, Past Medical History, Past Surgical History, Family History, and Social History were reviewed in [employer] record.  ROS  The following are not active complaints unless bolded  CXR  10/30/2013 :  Grossly unchanged cardiac silhouette and mediastinal contours with mild nodular prominence of the inferior aspect of the right paratracheal stripe as well as the bilateral pulmonary hila, right greater than left. Grossly unchanged suspected approximately 0.8 cm nodule with the medial aspect of the right upper lung. Unchanged mild slightly nodular thickening of the pulmonary interstitium  Symptom onset (cough and groin adenopathy late 2012)  - Bx 04/07/12  Inguinal nodes  > Pos NCG  - Ophthalmology eval rec 08/27/12> never went to rec again today  A good rule of thumb is that if prednisone is not needed at dx of sarcoid, it probably won't be later in the course and this is definitely the case here > no need for rx now in the third year of the dz, with hope for gradual "remission" likely in the next year > f/u can be in 12 months, sooner if needed   OV 03/10/2020  Subjective:  Patient ID: Frederick Macias, male , DOB: [birth year] , age 44 y.o. , MRN: [id number] , ADDRESS: [address] [facility] [address]   03/10/2020 -   Chief Complaint  Patient presents with  .  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  He is here for a new visit.  He tells me that lifelong he has had spring and seasonal allergies that involve running eyes and runny nose and sneezing.  He finally saw Dr. Patrice Paradise 2 weeks ago.  He had allergy testing that he is positive for all grasses and mold.  He is going to start allergy shots pretty soon.  During this time he expressed history of sarcoidosis as documented above by Dr. Carmin Richmond.  In 2013 he had inguinal lymph nodes and and he had this biopsied by Dr. Magnus Ivan this was  consistent with granuloma and consistent with sarcoid.  He saw Dr. Sandrea Hughs.  He was reassured.  He followed till 2015.  He had a clear chest x-ray back then.  After that he has not followed up.  He said he is not fully satisfied with the advised that the disease will go into remission and in 3 years or so.  This is because even to this day he can feel inguinal nodes although they are not as painful as they used to be.  They are small and mobile size.  [I could not definitely palpate them in the standing position].  He also says that all along he has had chest tightness particularly when exposed to humidity and like hot water bath.  He feels he needs to take a deep breath.  He feels he cannot get a full deep breath.  He feels his chest is tight but there is no shortness of breath or wheezing as such otherwise.  There is no cough.  He also tells me that all these 5 or 6 years he has had palpitations on and off.  He has had a spot EKG that was normal but does not recollect having a 24-hour event monitor of seeing the cardiologist.  He is really worried about the sarcoid and wants to know if it is still persistent.  Therefore Dr.  Callas requested that he see me.     ROS - per HPI     has a past medical history of Allergy, Arthritis, DDD (degenerative disc disease), Genital herpes, Obesity, and Sarcoidosis.   reports that he has never smoked. He has never used smokeless tobacco.  Past Surgical History:  Procedure Laterality Date  . LUMBAR DISC SURGERY  2007   Dr. Juliene Pina  . LYMPH NODE BIOPSY  2013    No Known Allergies  Immunization History  Administered Date(s) Administered  . Influenza Split 07/15/2013    Family History  Problem Relation Age of Onset  . Arthritis Father   . Diabetes Paternal Grandfather   . Arthritis Paternal Grandfather      Current Outpatient Medications:  .  cetirizine (ZYRTEC) 10 MG tablet, Take 10 mg by mouth daily., Disp: , Rfl:  .  Multiple  Vitamins-Minerals (MULTIVITAMIN WITH MINERALS) tablet, Take 1 tablet by mouth daily., Disp: , Rfl:  .  Naproxen Sodium (NAPRELAN) 375 MG TB24, Take 1 tablet by mouth daily. , Disp: , Rfl:  .  valACYclovir (VALTREX) 500 MG tablet, Take 500 mg by mouth 2 (two) times daily., Disp: , Rfl:       Objective:   Vitals:   03/10/20 1429  BP: 118/78  Pulse: 84  Resp: 18  Temp: 97.8 F (36.6 C)  TempSrc: Oral  SpO2: 99%  Weight: 246 lb (111.6 kg)  Height: 5\' 8"  (  1.727 m)    Estimated body mass index is 37.4 kg/m as calculated from the following:   Height as of this encounter: 5\' 8"  (1.727 m).   Weight as of this encounter: 246 lb (111.6 kg).  @WEIGHTCHANGE @    03/10/20 1429  Weight: 246 lb (111.6 kg)     Physical Exam  General Appearance:    Alert, cooperative, no distress, appears stated age - yes , Deconditioned looking - no , OBESE  - yes, Sitting on Wheelchair -  no  Head:    Normocephalic, without obvious abnormality, atraumatic  Eyes:    PERRL, conjunctiva/corneas clear,  Ears:    Normal TM's and external ear canals, both ears  Nose:   Nares normal, septum midline, mucosa normal, no drainage    or sinus tenderness. OXYGEN ON  - no . Patient is @ ra   Throat:   Lips, mucosa, and tongue normal; teeth and gums normal. Cyanosis on lips - no  Neck:   Supple, symmetrical, trachea midline, no adenopathy;    thyroid:  no enlargement/tenderness/nodules; no carotid   bruit or JVD  Back:     Symmetric, no curvature, ROM normal, no CVA tenderness  Lungs:     Distress - no , Wheeze no, Barrell Chest - no, Purse lip breathing - no, Crackles - no   Chest Wall:    No tenderness or deformity.    Heart:    Regular rate and rhythm, S1 and S2 normal, no rub   or gallop, Murmur - no  Breast Exam:    NOT DONE  Abdomen:     Soft, non-tender, bowel sounds active all four quadrants,    no masses, no organomegaly. Visceral obesity - yes  Genitalia:   NOT DONE  Rectal:   NOT DONE   Extremities:   Extremities - normal, Has Cane - no, Clubbing - no, Edema - no  Pulses:   2+ and symmetric all extremities  Skin:   Stigmata of Connective Tissue Disease - no  Lymph nodes:   Cervical, supraclavicular, and axillary nodes normal  Psychiatric:  Neurologic:   Pleasant - yes, Anxious - no, Flat affect - no  CAm-ICU - neg, Alert and Oriented x 3 - yes, Moves all 4s - yes, Speech - normal, Cognition - intact           Assessment:       ICD-10-CM   1. Sarcoidosis  D86.9   2. Tightness in chest  R07.89   3. History of seasonal allergies  Z88.9   4. Palpitations  R00.2    His pulmonary symptoms might not be sarcoidosis but could be asthma but on the other hand it could also be sarcoid related.  I think given his significant concern about this probably best to put the issue to rest and get a CT scan of the chest with contrast.  Also use the opportunity to get pulmonary function test and nitric oxide testing.  First palpitations in the setting of sarcoid he might need a Holter or event monitor but at this point he wants to wait on that.  This is fine.    Plan:     Patient Instructions     ICD-10-CM   1. Sarcoidosis  D86.9   2. Tightness in chest  R07.89   3. History of seasonal allergies  Z88.9   4. Palpitations  R00.2    Plan  -Get CT scan of the chest with contrast [we will  need to do a chemistry blood panel before that]  -Get full pulmonary function test =-Get exhaled nitric oxide testing -Sign release to get records from Dr. Donneta Romberg -Hold off  any cardiology evaluation for palpitations for the moment  Follow-up -Please complete the test and return for a face-to-face meeting or video visit with nurse practitioner in the month of June 2021  -Subsequent follow-up can be with Dr. Chase Caller because my schedule is currently full for June 2021  -Or he can return to see me Christena Sunderlin in July 2021 [schedule open up pretty soon for July]      SIGNATURE    Dr. Brand Males, M.D., F.C.C.P,  Pulmonary and Critical Care Medicine Staff Physician, [place] Director - Interstitial Lung Disease  Program  Pulmonary [facility] [place], [place], [address]  Pager: [phone], If no answer or between  15:00h - 7:00h: call [phone] Telephone: [phone]  3:09 PM 03/10/2020

## 2020-03-10 NOTE — Patient Instructions (Addendum)
ICD-10-CM   1. Sarcoidosis  D86.9   2. Tightness in chest  R07.89   3. History of seasonal allergies  Z88.9   4. Palpitations  R00.2    Plan  -Get CT scan of the chest with contrast [we will need to do a chemistry blood panel before that]  -Get full pulmonary function test =-Get exhaled nitric oxide testing -Sign release to get records from Dr. Charles Mix Callas -Hold off  any cardiology evaluation for palpitations for the moment  Follow-up -Please complete the test and return for a face-to-face meeting or video visit with nurse practitioner in the month of June 2021  -Subsequent follow-up can be with Dr. Marchelle Macias because my schedule is currently full for June 2021  -Or he can return to see me Frederick Macias in July 2021 [schedule open up pretty soon for July]

## 2020-04-15 ENCOUNTER — Telehealth: Payer: Self-pay | Admitting: Internal Medicine

## 2020-04-15 NOTE — Telephone Encounter (Signed)
Records from [facility] allergy signed and sent for scanning

## 2020-04-25 ENCOUNTER — Other Ambulatory Visit (HOSPITAL_COMMUNITY): Payer: BC Managed Care – PPO

## 2020-04-27 ENCOUNTER — Other Ambulatory Visit: Payer: Self-pay | Admitting: *Deleted

## 2020-04-27 DIAGNOSIS — D869 Sarcoidosis, unspecified: Secondary | ICD-10-CM

## 2020-04-28 ENCOUNTER — Other Ambulatory Visit: Payer: Self-pay

## 2020-04-28 ENCOUNTER — Ambulatory Visit (INDEPENDENT_AMBULATORY_CARE_PROVIDER_SITE_OTHER): Payer: BC Managed Care – PPO | Admitting: Internal Medicine

## 2020-04-28 DIAGNOSIS — D869 Sarcoidosis, unspecified: Secondary | ICD-10-CM

## 2020-04-28 LAB — PULMONARY FUNCTION TEST
DL/VA % pred: 121 %
DL/VA: 5.53 ml/min/mmHg/L
DLCO cor % pred: 124 %
DLCO cor: 36.21 ml/min/mmHg
DLCO unc % pred: 124 %
DLCO unc: 36.21 ml/min/mmHg
FEF 25-75 Post: 5.35 L/sec
FEF 25-75 Pre: 4.26 L/sec
FEF2575-%Change-Post: 25 %
FEF2575-%Pred-Post: 149 %
FEF2575-%Pred-Pre: 118 %
FEV1-%Change-Post: 7 %
FEV1-%Pred-Post: 99 %
FEV1-%Pred-Pre: 92 %
FEV1-Post: 3.91 L
FEV1-Pre: 3.64 L
FEV1FVC-%Change-Post: 1 %
FEV1FVC-%Pred-Pre: 106 %
FEV6-%Change-Post: 6 %
FEV6-%Pred-Post: 95 %
FEV6-%Pred-Pre: 89 %
FEV6-Post: 4.61 L
FEV6-Pre: 4.34 L
FEV6FVC-%Pred-Post: 103 %
FEV6FVC-%Pred-Pre: 103 %
FVC-%Change-Post: 5 %
FVC-%Pred-Post: 92 %
FVC-%Pred-Pre: 87 %
FVC-Post: 4.61 L
FVC-Pre: 4.36 L
Post FEV1/FVC ratio: 85 %
Post FEV6/FVC ratio: 100 %
Pre FEV1/FVC ratio: 84 %
Pre FEV6/FVC Ratio: 100 %
RV % pred: 125 %
RV: 2.38 L
TLC % pred: 110 %
TLC: 7.45 L

## 2020-04-28 NOTE — Progress Notes (Signed)
Full PFT performed today. °

## 2020-04-29 ENCOUNTER — Encounter: Payer: Self-pay | Admitting: Adult Health

## 2020-04-29 ENCOUNTER — Telehealth (INDEPENDENT_AMBULATORY_CARE_PROVIDER_SITE_OTHER): Payer: BC Managed Care – PPO | Admitting: Adult Health

## 2020-04-29 DIAGNOSIS — D869 Sarcoidosis, unspecified: Secondary | ICD-10-CM | POA: Diagnosis not present

## 2020-04-29 DIAGNOSIS — R079 Chest pain, unspecified: Secondary | ICD-10-CM

## 2020-04-29 MED ORDER — BUDESONIDE-FORMOTEROL FUMARATE 80-4.5 MCG/ACT IN AERO
2.0000 | INHALATION_SPRAY | Freq: Two times a day (BID) | RESPIRATORY_TRACT | 5 refills | Status: DC
Start: 2020-04-29 — End: 2020-06-30

## 2020-04-29 NOTE — Progress Notes (Signed)
Follow-up in 6 weeks and as needed virtual Visit via Video Note  I connected with Frederick Macias on 04/29/20 at  9:30 AM EDT by a video enabled telemedicine application and verified that I am speaking with the correct person using two identifiers.  Location: Patient: Home Provider: Office   I discussed the limitations of evaluation and management by telemedicine and the availability of in person appointments. The patient expressed understanding and agreed to proceed.  History of Present Illness: 44 year old never smoker seen initially in November 2013 by Dr. Sherene Sires for abnormal chest x-ray inguinal lymphadenopathy.  Underwent biopsy of left inguinal lymph node compatible with sarcoid (pathology-granuloma).  Referred again Mar 10, 2020 for pulmonary consult for exertional dyspnea  Today's televisit is a 6-week follow-up for sarcoid and dyspnea.  Patient had been complaining of getting winded with exercise feeling that he could not take in a deep breath and chest tightness with activity.  Patient was set up for pulmonary function testing which was done April 28, 2020 that showed normal lung function with FEV1 at 99% ratio 85, FVC 92%, no significant bronchodilator response.  Mid flows without obstruction.  Positive reversibility.  DLCO to 124%. Patient says he has chronic allergies and is seen by the allergist. Patient was given albuterol which he says does help quite a bit however seems to wear off very quickly. He says he continues to get some occasional sharp pains in his left chest with activities and chest tightness.  Patient denies any hypertension hyperlipidemia.  No family history of coronary artery disease.  No presyncope or syncope.  Covid vaccines are up-to-date.  Denies any significant cough or wheezing.  Patient Active Problem List   Diagnosis Date Noted  . Sarcoidosis 08/29/2012  . Cough 08/29/2012  . Lymphadenopathy, inguinal 03/31/2012    Current Outpatient Medications on File  Prior to Visit  Medication Sig Dispense Refill  . cetirizine (ZYRTEC) 10 MG tablet Take 10 mg by mouth daily.    . Multiple Vitamins-Minerals (MULTIVITAMIN WITH MINERALS) tablet Take 1 tablet by mouth daily.    . Naproxen Sodium (NAPRELAN) 375 MG TB24 Take 1 tablet by mouth daily.     . valACYclovir (VALTREX) 500 MG tablet Take 500 mg by mouth 2 (two) times daily.     No current facility-administered medications on file prior to visit.      Observations/Objective: Speaks in full sentences appears to be comfortable talking.  No obvious distress.  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  Patient does have clinical improvement with albuterol.  We will begin LABA ICS combo. Pulmonary function testing shows normal lung function.  Exertional chest tightness questionable etiology may be related to exercise-induced asthma.  However with history of sarcoid and exertional chest pain will refer to cardiology for evaluation.  Sarcoidosis with previous lymphadenopathy.  Pulmonary function testing shows normal lung function and normal diffusing capacity.  Will check CT chest with contrast  Plan  Patient Instructions  Begin Symbicort 2 puffs Twice daily  , rinse after use  Albuterol 1-2 puffs every 4hrs as needed for wheezing /shortness of breath  Refer to Cardiology .  Set up CT chest w/ contrast.  Follow up in 6 weeks with Dr. Marchelle Gearing or Frederick Nabor NP  Please contact office for sooner follow up if symptoms do not improve or worsen or seek emergency care        Follow Up Instructions:    I discussed the assessment and treatment plan with the patient. The  patient was provided an opportunity to ask questions and all were answered. The patient agreed with the plan and demonstrated an understanding of the instructions.   The patient was advised to call back or seek an in-person evaluation if the symptoms worsen or if the condition fails to improve as  anticipated.  I provided 35 minutes of non-face-to-face time during this encounter.   Rubye Oaks, NP

## 2020-04-29 NOTE — Patient Instructions (Addendum)
Begin Symbicort 2 puffs Twice daily  , rinse after use  Albuterol 1-2 puffs every 4hrs as needed for wheezing /shortness of breath  Refer to Cardiology .  Set up CT chest w/ contrast.  Follow up in 6 weeks with Dr. Marchelle Gearing or Zahriah Roes NP  Please contact office for sooner follow up if symptoms do not improve or worsen or seek emergency care

## 2020-05-09 ENCOUNTER — Other Ambulatory Visit: Payer: BC Managed Care – PPO

## 2020-05-19 ENCOUNTER — Ambulatory Visit (INDEPENDENT_AMBULATORY_CARE_PROVIDER_SITE_OTHER)
Admission: RE | Admit: 2020-05-19 | Discharge: 2020-05-19 | Disposition: A | Discharge status: Home / Self Care | Payer: BC Managed Care – PPO | Source: Ambulatory Visit | Attending: Adult Health | Admitting: Adult Health

## 2020-05-19 ENCOUNTER — Other Ambulatory Visit: Payer: Self-pay

## 2020-05-19 DIAGNOSIS — R918 Other nonspecific abnormal finding of lung field: Secondary | ICD-10-CM | POA: Diagnosis not present

## 2020-05-19 DIAGNOSIS — D869 Sarcoidosis, unspecified: Secondary | ICD-10-CM | POA: Diagnosis not present

## 2020-05-19 DIAGNOSIS — I898 Other specified noninfective disorders of lymphatic vessels and lymph nodes: Secondary | ICD-10-CM | POA: Diagnosis not present

## 2020-05-19 DIAGNOSIS — F4323 Adjustment disorder with mixed anxiety and depressed mood: Secondary | ICD-10-CM | POA: Diagnosis not present

## 2020-05-19 DIAGNOSIS — R0789 Other chest pain: Secondary | ICD-10-CM | POA: Diagnosis not present

## 2020-05-19 HISTORY — DX: Unspecified asthma, uncomplicated: J45.909

## 2020-05-19 MED ORDER — IOHEXOL 300 MG/ML  SOLN
80.0000 mL | Freq: Once | INTRAMUSCULAR | Status: AC | PRN
  Administered 2020-05-19: 80 mL via INTRAVENOUS

## 2020-05-24 ENCOUNTER — Telehealth: Payer: Self-pay | Admitting: Internal Medicine

## 2020-05-25 NOTE — Telephone Encounter (Signed)
Spoke with the pt  He is asking for the ct chest results from 05/20/20  Please advise, thanks!

## 2020-05-26 DIAGNOSIS — F322 Major depressive disorder, single episode, severe without psychotic features: Secondary | ICD-10-CM | POA: Diagnosis not present

## 2020-05-26 DIAGNOSIS — F419 Anxiety disorder, unspecified: Secondary | ICD-10-CM | POA: Diagnosis not present

## 2020-05-26 DIAGNOSIS — E669 Obesity, unspecified: Secondary | ICD-10-CM | POA: Diagnosis not present

## 2020-05-26 DIAGNOSIS — D869 Sarcoidosis, unspecified: Secondary | ICD-10-CM | POA: Diagnosis not present

## 2020-05-26 DIAGNOSIS — F4323 Adjustment disorder with mixed anxiety and depressed mood: Secondary | ICD-10-CM | POA: Diagnosis not present

## 2020-05-26 NOTE — Telephone Encounter (Signed)
Spoke with the pt and notified of results per Tammy and he verbalized understanding Appt with MR was scheduled

## 2020-05-26 NOTE — Telephone Encounter (Signed)
Frederick Macias, [name], NP  [name], CMA [name] changes in lymph nodes -stable  Lungs look clear. No sarcoid changes. No ILD .  No f/up ov with Dr. Marchelle Gearing was made please set up and will discuss in detail on return ov

## 2020-05-26 NOTE — Telephone Encounter (Signed)
Pt returning call.  [phone]

## 2020-05-26 NOTE — Telephone Encounter (Signed)
lmtcb X1 for pt to relay results/recs.  

## 2020-06-09 DIAGNOSIS — F4323 Adjustment disorder with mixed anxiety and depressed mood: Secondary | ICD-10-CM | POA: Diagnosis not present

## 2020-06-14 DIAGNOSIS — F4323 Adjustment disorder with mixed anxiety and depressed mood: Secondary | ICD-10-CM | POA: Diagnosis not present

## 2020-06-15 DIAGNOSIS — J3089 Other allergic rhinitis: Secondary | ICD-10-CM | POA: Diagnosis not present

## 2020-06-15 DIAGNOSIS — J3081 Allergic rhinitis due to animal (cat) (dog) hair and dander: Secondary | ICD-10-CM | POA: Diagnosis not present

## 2020-06-15 DIAGNOSIS — H1045 Other chronic allergic conjunctivitis: Secondary | ICD-10-CM | POA: Diagnosis not present

## 2020-06-15 DIAGNOSIS — J301 Allergic rhinitis due to pollen: Secondary | ICD-10-CM | POA: Diagnosis not present

## 2020-06-17 ENCOUNTER — Ambulatory Visit: Payer: BC Managed Care – PPO | Admitting: Internal Medicine

## 2020-06-23 DIAGNOSIS — J3081 Allergic rhinitis due to animal (cat) (dog) hair and dander: Secondary | ICD-10-CM | POA: Diagnosis not present

## 2020-06-23 DIAGNOSIS — J301 Allergic rhinitis due to pollen: Secondary | ICD-10-CM | POA: Diagnosis not present

## 2020-06-23 DIAGNOSIS — F4323 Adjustment disorder with mixed anxiety and depressed mood: Secondary | ICD-10-CM | POA: Diagnosis not present

## 2020-06-23 DIAGNOSIS — J3089 Other allergic rhinitis: Secondary | ICD-10-CM | POA: Diagnosis not present

## 2020-06-28 DIAGNOSIS — J3081 Allergic rhinitis due to animal (cat) (dog) hair and dander: Secondary | ICD-10-CM | POA: Diagnosis not present

## 2020-06-28 DIAGNOSIS — J3089 Other allergic rhinitis: Secondary | ICD-10-CM | POA: Diagnosis not present

## 2020-06-28 DIAGNOSIS — J301 Allergic rhinitis due to pollen: Secondary | ICD-10-CM | POA: Diagnosis not present

## 2020-06-30 ENCOUNTER — Other Ambulatory Visit: Payer: Self-pay | Admitting: Adult Health

## 2020-06-30 DIAGNOSIS — J301 Allergic rhinitis due to pollen: Secondary | ICD-10-CM | POA: Diagnosis not present

## 2020-06-30 DIAGNOSIS — J3081 Allergic rhinitis due to animal (cat) (dog) hair and dander: Secondary | ICD-10-CM | POA: Diagnosis not present

## 2020-06-30 DIAGNOSIS — J3089 Other allergic rhinitis: Secondary | ICD-10-CM | POA: Diagnosis not present

## 2020-06-30 DIAGNOSIS — F4323 Adjustment disorder with mixed anxiety and depressed mood: Secondary | ICD-10-CM | POA: Diagnosis not present

## 2020-07-05 DIAGNOSIS — J3081 Allergic rhinitis due to animal (cat) (dog) hair and dander: Secondary | ICD-10-CM | POA: Diagnosis not present

## 2020-07-05 DIAGNOSIS — J3089 Other allergic rhinitis: Secondary | ICD-10-CM | POA: Diagnosis not present

## 2020-07-05 DIAGNOSIS — J301 Allergic rhinitis due to pollen: Secondary | ICD-10-CM | POA: Diagnosis not present

## 2020-07-07 DIAGNOSIS — F4323 Adjustment disorder with mixed anxiety and depressed mood: Secondary | ICD-10-CM | POA: Diagnosis not present

## 2020-07-13 ENCOUNTER — Ambulatory Visit: Payer: BC Managed Care – PPO | Admitting: Internal Medicine

## 2020-07-14 DIAGNOSIS — F4323 Adjustment disorder with mixed anxiety and depressed mood: Secondary | ICD-10-CM | POA: Diagnosis not present

## 2020-07-26 DIAGNOSIS — J3089 Other allergic rhinitis: Secondary | ICD-10-CM | POA: Diagnosis not present

## 2020-07-26 DIAGNOSIS — J3081 Allergic rhinitis due to animal (cat) (dog) hair and dander: Secondary | ICD-10-CM | POA: Diagnosis not present

## 2020-07-26 DIAGNOSIS — J301 Allergic rhinitis due to pollen: Secondary | ICD-10-CM | POA: Diagnosis not present

## 2020-07-28 DIAGNOSIS — F1010 Alcohol abuse, uncomplicated: Secondary | ICD-10-CM | POA: Diagnosis not present

## 2020-07-29 DIAGNOSIS — J3089 Other allergic rhinitis: Secondary | ICD-10-CM | POA: Diagnosis not present

## 2020-07-29 DIAGNOSIS — J3081 Allergic rhinitis due to animal (cat) (dog) hair and dander: Secondary | ICD-10-CM | POA: Diagnosis not present

## 2020-07-29 DIAGNOSIS — J301 Allergic rhinitis due to pollen: Secondary | ICD-10-CM | POA: Diagnosis not present

## 2020-08-03 DIAGNOSIS — J301 Allergic rhinitis due to pollen: Secondary | ICD-10-CM | POA: Diagnosis not present

## 2020-08-03 DIAGNOSIS — J3081 Allergic rhinitis due to animal (cat) (dog) hair and dander: Secondary | ICD-10-CM | POA: Diagnosis not present

## 2020-08-03 DIAGNOSIS — J3089 Other allergic rhinitis: Secondary | ICD-10-CM | POA: Diagnosis not present

## 2020-08-04 DIAGNOSIS — F1010 Alcohol abuse, uncomplicated: Secondary | ICD-10-CM | POA: Diagnosis not present

## 2020-08-10 DIAGNOSIS — F1010 Alcohol abuse, uncomplicated: Secondary | ICD-10-CM | POA: Diagnosis not present

## 2020-08-17 DIAGNOSIS — F1010 Alcohol abuse, uncomplicated: Secondary | ICD-10-CM | POA: Diagnosis not present

## 2020-08-18 DIAGNOSIS — J3089 Other allergic rhinitis: Secondary | ICD-10-CM | POA: Diagnosis not present

## 2020-08-18 DIAGNOSIS — J301 Allergic rhinitis due to pollen: Secondary | ICD-10-CM | POA: Diagnosis not present

## 2020-08-18 DIAGNOSIS — J3081 Allergic rhinitis due to animal (cat) (dog) hair and dander: Secondary | ICD-10-CM | POA: Diagnosis not present

## 2020-08-23 DIAGNOSIS — J3089 Other allergic rhinitis: Secondary | ICD-10-CM | POA: Diagnosis not present

## 2020-08-23 DIAGNOSIS — J3081 Allergic rhinitis due to animal (cat) (dog) hair and dander: Secondary | ICD-10-CM | POA: Diagnosis not present

## 2020-08-23 DIAGNOSIS — J301 Allergic rhinitis due to pollen: Secondary | ICD-10-CM | POA: Diagnosis not present

## 2020-08-24 DIAGNOSIS — F1010 Alcohol abuse, uncomplicated: Secondary | ICD-10-CM | POA: Diagnosis not present

## 2020-08-25 DIAGNOSIS — J3089 Other allergic rhinitis: Secondary | ICD-10-CM | POA: Diagnosis not present

## 2020-08-25 DIAGNOSIS — J3081 Allergic rhinitis due to animal (cat) (dog) hair and dander: Secondary | ICD-10-CM | POA: Diagnosis not present

## 2020-08-25 DIAGNOSIS — J301 Allergic rhinitis due to pollen: Secondary | ICD-10-CM | POA: Diagnosis not present

## 2020-08-31 DIAGNOSIS — F1010 Alcohol abuse, uncomplicated: Secondary | ICD-10-CM | POA: Diagnosis not present

## 2020-09-02 DIAGNOSIS — J301 Allergic rhinitis due to pollen: Secondary | ICD-10-CM | POA: Diagnosis not present

## 2020-09-02 DIAGNOSIS — J3089 Other allergic rhinitis: Secondary | ICD-10-CM | POA: Diagnosis not present

## 2020-09-02 DIAGNOSIS — J3081 Allergic rhinitis due to animal (cat) (dog) hair and dander: Secondary | ICD-10-CM | POA: Diagnosis not present

## 2020-09-06 DIAGNOSIS — J3081 Allergic rhinitis due to animal (cat) (dog) hair and dander: Secondary | ICD-10-CM | POA: Diagnosis not present

## 2020-09-06 DIAGNOSIS — J3089 Other allergic rhinitis: Secondary | ICD-10-CM | POA: Diagnosis not present

## 2020-09-06 DIAGNOSIS — J301 Allergic rhinitis due to pollen: Secondary | ICD-10-CM | POA: Diagnosis not present

## 2020-09-07 DIAGNOSIS — F1010 Alcohol abuse, uncomplicated: Secondary | ICD-10-CM | POA: Diagnosis not present

## 2020-09-14 DIAGNOSIS — F1010 Alcohol abuse, uncomplicated: Secondary | ICD-10-CM | POA: Diagnosis not present

## 2020-09-15 DIAGNOSIS — J301 Allergic rhinitis due to pollen: Secondary | ICD-10-CM | POA: Diagnosis not present

## 2020-09-15 DIAGNOSIS — J3089 Other allergic rhinitis: Secondary | ICD-10-CM | POA: Diagnosis not present

## 2020-09-15 DIAGNOSIS — J3081 Allergic rhinitis due to animal (cat) (dog) hair and dander: Secondary | ICD-10-CM | POA: Diagnosis not present

## 2020-09-21 DIAGNOSIS — J301 Allergic rhinitis due to pollen: Secondary | ICD-10-CM | POA: Diagnosis not present

## 2020-09-21 DIAGNOSIS — F1010 Alcohol abuse, uncomplicated: Secondary | ICD-10-CM | POA: Diagnosis not present

## 2020-09-21 DIAGNOSIS — J3089 Other allergic rhinitis: Secondary | ICD-10-CM | POA: Diagnosis not present

## 2020-09-21 DIAGNOSIS — J3081 Allergic rhinitis due to animal (cat) (dog) hair and dander: Secondary | ICD-10-CM | POA: Diagnosis not present

## 2020-09-28 DIAGNOSIS — J301 Allergic rhinitis due to pollen: Secondary | ICD-10-CM | POA: Diagnosis not present

## 2020-09-28 DIAGNOSIS — J3081 Allergic rhinitis due to animal (cat) (dog) hair and dander: Secondary | ICD-10-CM | POA: Diagnosis not present

## 2020-09-28 DIAGNOSIS — J3089 Other allergic rhinitis: Secondary | ICD-10-CM | POA: Diagnosis not present

## 2020-09-30 DIAGNOSIS — J301 Allergic rhinitis due to pollen: Secondary | ICD-10-CM | POA: Diagnosis not present

## 2020-09-30 DIAGNOSIS — J3089 Other allergic rhinitis: Secondary | ICD-10-CM | POA: Diagnosis not present

## 2020-09-30 DIAGNOSIS — J3081 Allergic rhinitis due to animal (cat) (dog) hair and dander: Secondary | ICD-10-CM | POA: Diagnosis not present

## 2020-10-04 DIAGNOSIS — J301 Allergic rhinitis due to pollen: Secondary | ICD-10-CM | POA: Diagnosis not present

## 2020-10-04 DIAGNOSIS — J3081 Allergic rhinitis due to animal (cat) (dog) hair and dander: Secondary | ICD-10-CM | POA: Diagnosis not present

## 2020-10-04 DIAGNOSIS — J3089 Other allergic rhinitis: Secondary | ICD-10-CM | POA: Diagnosis not present

## 2020-11-02 DIAGNOSIS — Z0000 Encounter for general adult medical examination without abnormal findings: Secondary | ICD-10-CM | POA: Diagnosis not present

## 2020-12-13 DIAGNOSIS — J3081 Allergic rhinitis due to animal (cat) (dog) hair and dander: Secondary | ICD-10-CM | POA: Diagnosis not present

## 2020-12-13 DIAGNOSIS — J301 Allergic rhinitis due to pollen: Secondary | ICD-10-CM | POA: Diagnosis not present

## 2020-12-13 DIAGNOSIS — H1045 Other chronic allergic conjunctivitis: Secondary | ICD-10-CM | POA: Diagnosis not present

## 2020-12-13 DIAGNOSIS — J3089 Other allergic rhinitis: Secondary | ICD-10-CM | POA: Diagnosis not present

## 2020-12-30 DIAGNOSIS — J301 Allergic rhinitis due to pollen: Secondary | ICD-10-CM | POA: Diagnosis not present

## 2020-12-31 DIAGNOSIS — J3081 Allergic rhinitis due to animal (cat) (dog) hair and dander: Secondary | ICD-10-CM | POA: Diagnosis not present

## 2020-12-31 DIAGNOSIS — J3089 Other allergic rhinitis: Secondary | ICD-10-CM | POA: Diagnosis not present

## 2021-04-06 DIAGNOSIS — B078 Other viral warts: Secondary | ICD-10-CM | POA: Diagnosis not present

## 2021-04-06 DIAGNOSIS — D485 Neoplasm of uncertain behavior of skin: Secondary | ICD-10-CM | POA: Diagnosis not present

## 2021-04-06 DIAGNOSIS — D225 Melanocytic nevi of trunk: Secondary | ICD-10-CM | POA: Diagnosis not present

## 2021-04-06 DIAGNOSIS — L720 Epidermal cyst: Secondary | ICD-10-CM | POA: Diagnosis not present

## 2021-04-21 DIAGNOSIS — J029 Acute pharyngitis, unspecified: Secondary | ICD-10-CM | POA: Diagnosis not present

## 2021-07-03 DIAGNOSIS — M542 Cervicalgia: Secondary | ICD-10-CM | POA: Diagnosis not present

## 2021-07-16 DIAGNOSIS — M542 Cervicalgia: Secondary | ICD-10-CM | POA: Diagnosis not present

## 2021-07-20 DIAGNOSIS — M5030 Other cervical disc degeneration, unspecified cervical region: Secondary | ICD-10-CM | POA: Diagnosis not present

## 2021-07-20 DIAGNOSIS — M47812 Spondylosis without myelopathy or radiculopathy, cervical region: Secondary | ICD-10-CM | POA: Diagnosis not present

## 2021-07-20 DIAGNOSIS — M542 Cervicalgia: Secondary | ICD-10-CM | POA: Diagnosis not present

## 2021-08-09 DIAGNOSIS — M479 Spondylosis, unspecified: Secondary | ICD-10-CM | POA: Diagnosis not present

## 2021-08-29 DIAGNOSIS — R509 Fever, unspecified: Secondary | ICD-10-CM | POA: Diagnosis not present

## 2021-08-29 DIAGNOSIS — R059 Cough, unspecified: Secondary | ICD-10-CM | POA: Diagnosis not present

## 2021-08-29 DIAGNOSIS — Z20822 Contact with and (suspected) exposure to covid-19: Secondary | ICD-10-CM | POA: Diagnosis not present

## 2021-09-19 DIAGNOSIS — M47812 Spondylosis without myelopathy or radiculopathy, cervical region: Secondary | ICD-10-CM | POA: Diagnosis not present

## 2021-11-08 DIAGNOSIS — Z131 Encounter for screening for diabetes mellitus: Secondary | ICD-10-CM | POA: Diagnosis not present

## 2021-11-08 DIAGNOSIS — Z13220 Encounter for screening for lipoid disorders: Secondary | ICD-10-CM | POA: Diagnosis not present

## 2021-11-08 DIAGNOSIS — Z0000 Encounter for general adult medical examination without abnormal findings: Secondary | ICD-10-CM | POA: Diagnosis not present

## 2021-12-19 DIAGNOSIS — M47812 Spondylosis without myelopathy or radiculopathy, cervical region: Secondary | ICD-10-CM | POA: Diagnosis not present

## 2022-01-12 DIAGNOSIS — M47812 Spondylosis without myelopathy or radiculopathy, cervical region: Secondary | ICD-10-CM | POA: Diagnosis not present

## 2022-05-15 IMAGING — CT CT CHEST W/ CM
3 of 5 series · 15 of 36 positions shown, 17 images · IV contrast (omnipaque)
Comparison: Radiographs 04/19/2014.  Abdominal CT 09/04/2008.

CLINICAL DATA: Exertional chest tightness for a few months. History
of sarcoidosis and exercise-induced asthma.

EXAM:
CT CHEST WITH CONTRAST
TECHNIQUE: Multidetector CT imaging of the chest was performed during
intravenous contrast administration.
CONTRAST:  80mL OMNIPAQUE IOHEXOL 300 MG/ML  SOLN

[Series 2: thorax · axial · 0.84mm/px · z∈[-288,-40]mm · 8 of 160 slices shown, 10 images]
[im 18/160  mediastinal]
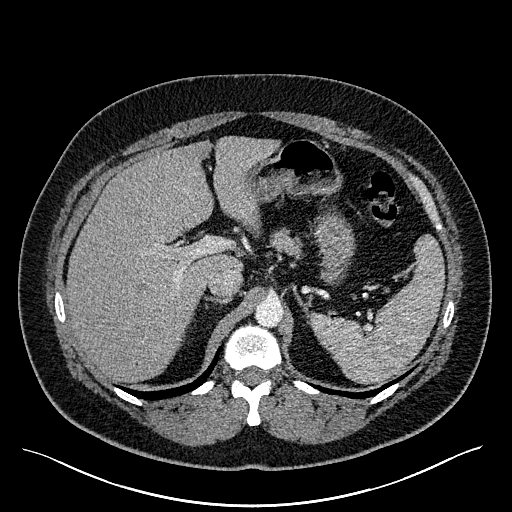
[im 18/160  lung]
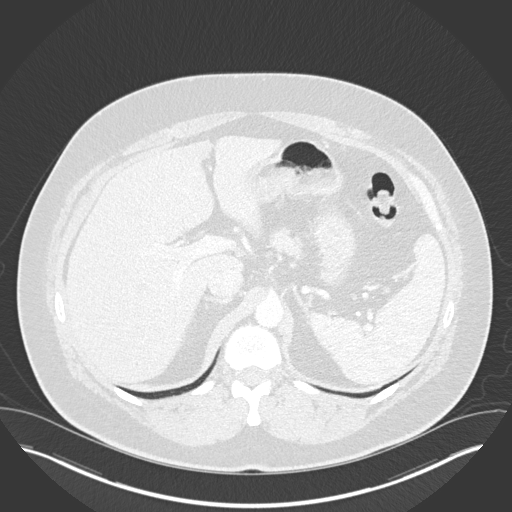
[im 36/160  lung]
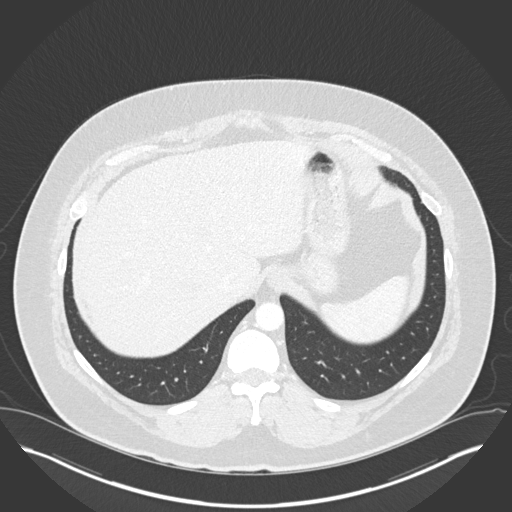
[im 54/160  lung]
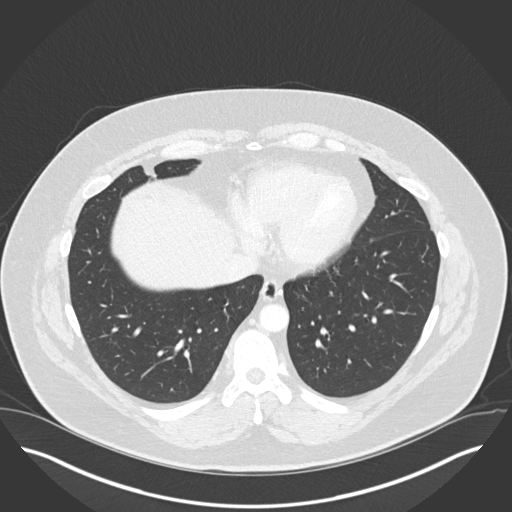
[im 71/160  lung]
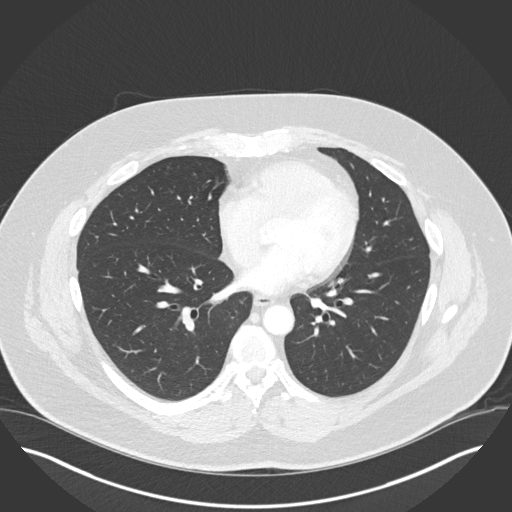
[im 89/160  mediastinal]
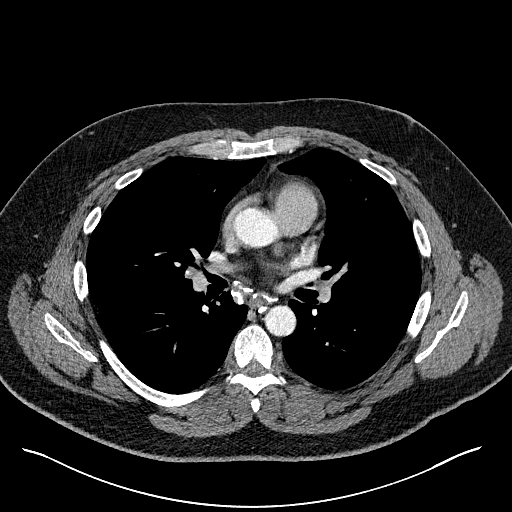
[im 89/160  lung]
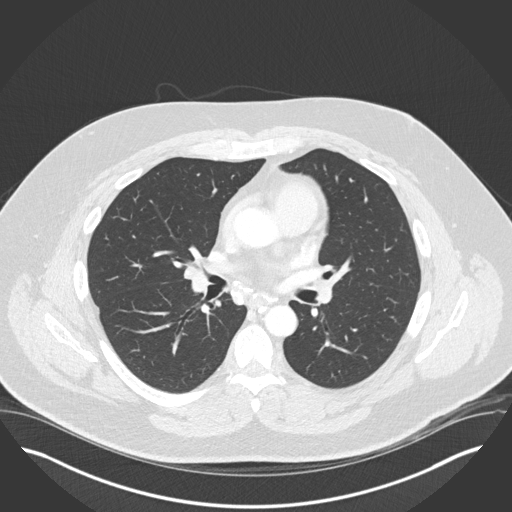
[im 107/160  lung]
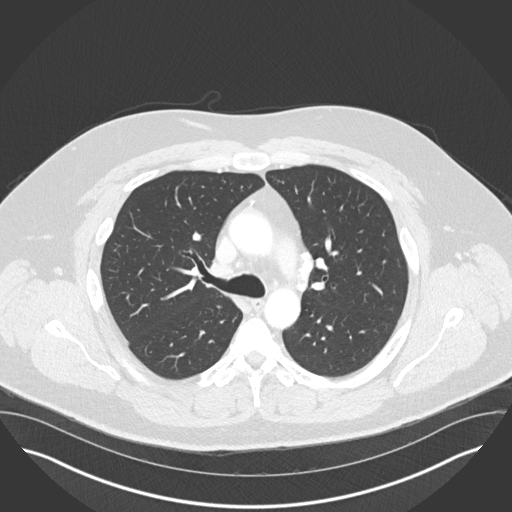
[im 124/160  lung]
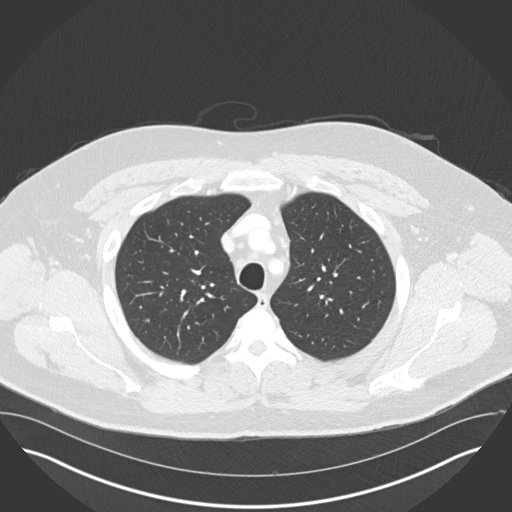
[im 142/160  lung]
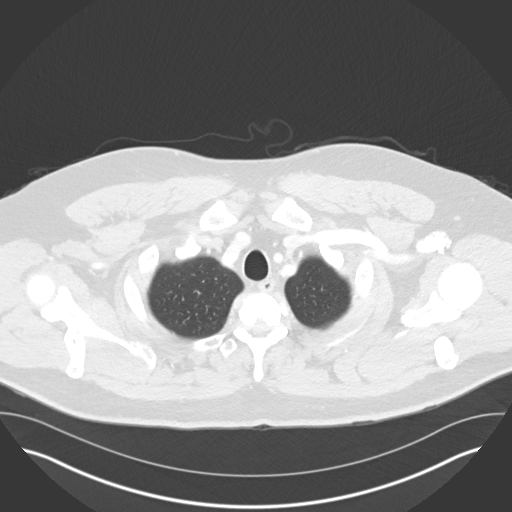

[Series 3: thorax lfov · axial · 0.88mm/px · z∈[-284,-164]mm · 4 of 160 slices shown]
[im 20/160  lung]
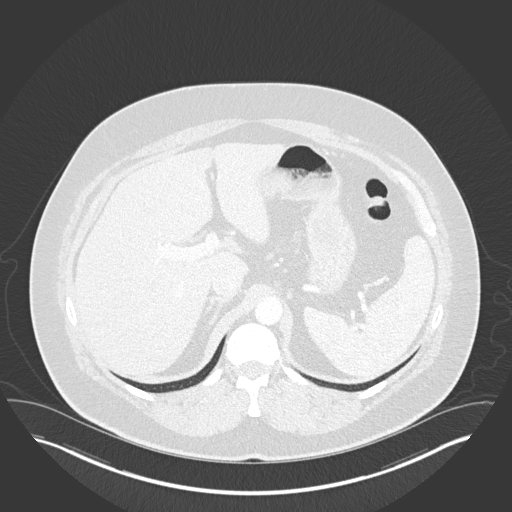
[im 40/160  lung]
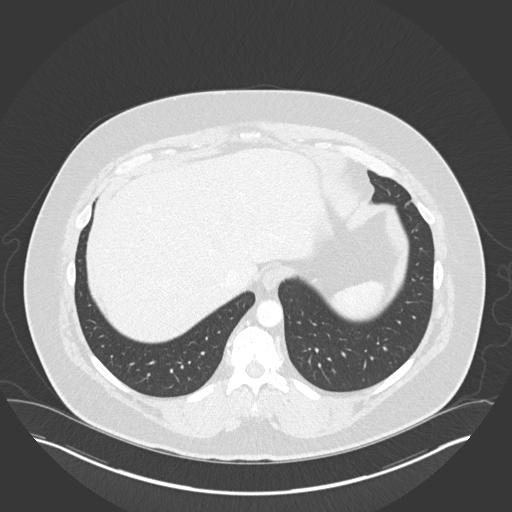
[im 60/160  lung]
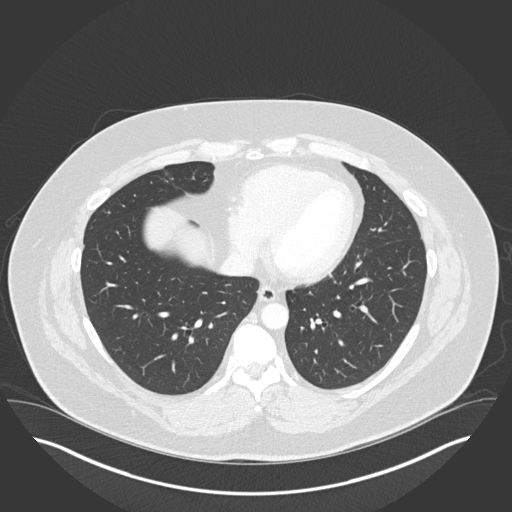
[im 80/160  lung]
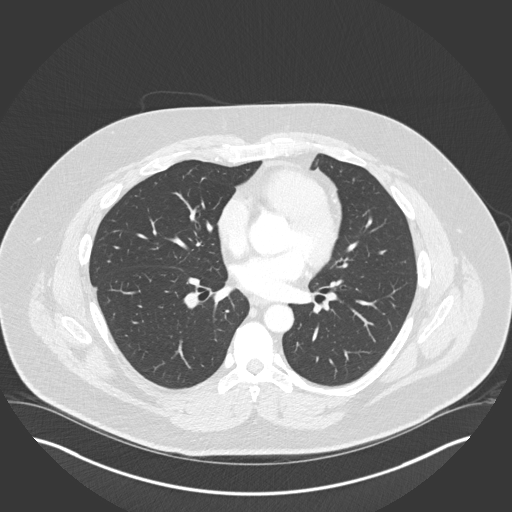

[Series 6: coronal · coronal · 0.64mm/px · 3 of 150 slices shown]
[im 30/150  lung]
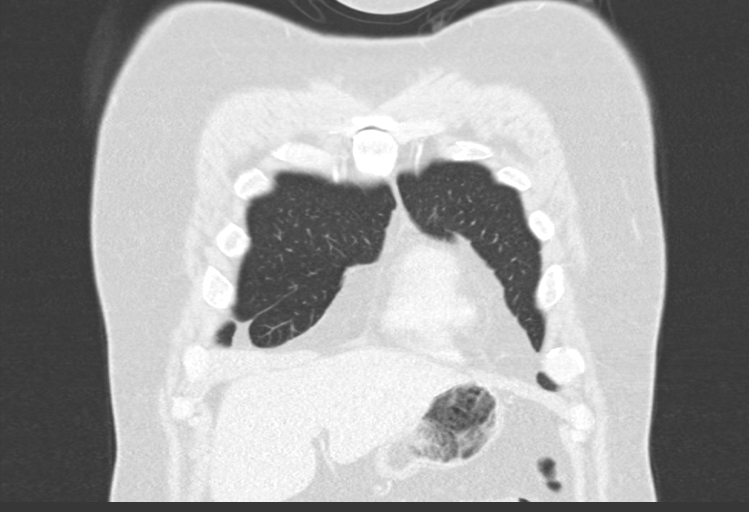
[im 60/150  lung]
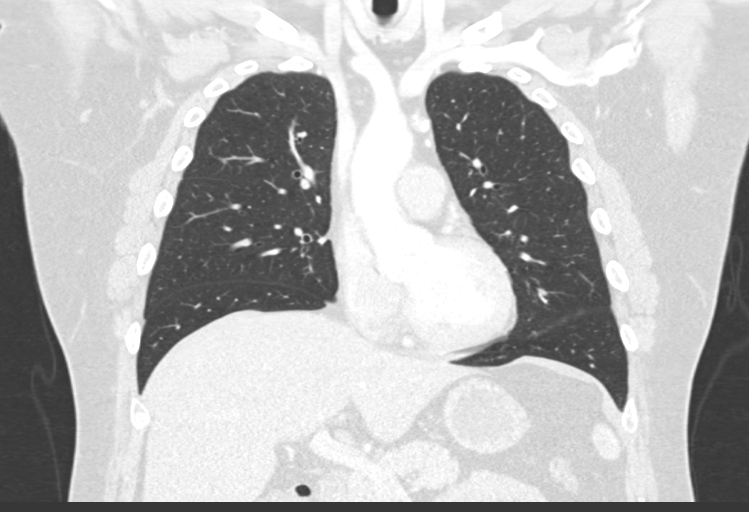
[im 90/150  lung]
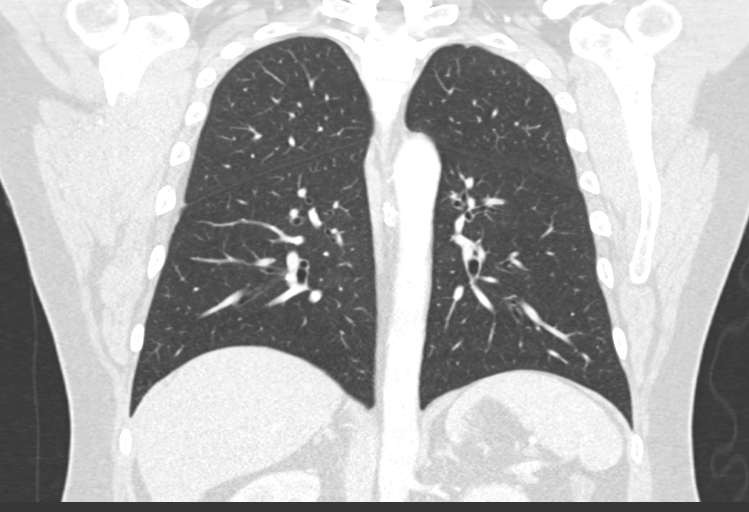

[15 of 36 positions shown; findings below may reference images not displayed]

FINDINGS: Cardiovascular: Suboptimal contrast opacification of the pulmonary
arteries without acute findings. No significant atherosclerosis or
acute vascular findings identified. The heart size is normal. There
is no pericardial effusion.

Mediastinum/Nodes: There are multiple partially calcified
mediastinal and hilar lymph nodes bilaterally. Representative nodes
include an 11 mm AP window node on image 40/2 and a subcarinal node
measuring 17 mm on image 71/2. There are no enlarged noncalcified
lymph nodes. There is no axillary adenopathy. The thyroid gland,
trachea and esophagus demonstrate no significant findings.

Lungs/Pleura: No pleural effusion or pneumothorax. No significant
pulmonary findings are identified. Specifically, no thickening of
the bronchovascular bundles, interstitial lung disease,
bronchiectasis or subpleural reticulation identified on routine
imaging.

Upper abdomen: No significant findings are seen within the
visualized upper abdomen. Small lymph nodes have decreased in size
compared with the previous study. There is a small low-density
lesion in the upper pole of the left kidney which is probably a
cyst.

Musculoskeletal/Chest wall: There is no chest wall mass or
suspicious osseous finding.
IMPRESSION: 1. No acute findings or explanation for the patient's symptoms.
2. Calcified mediastinal and hilar lymph nodes consistent with the
patient's history of sarcoidosis.
